# Patient Record
Sex: Female | Born: 1960 | Race: White | Hispanic: No | Marital: Married | Smoking: Current every day smoker
Health system: Southern US, Community
[De-identification: ages and names within clinical notes are randomized; demographics above are authoritative.]

## PROBLEM LIST (undated history)

## (undated) DIAGNOSIS — I639 Cerebral infarction, unspecified: Secondary | ICD-10-CM

## (undated) DIAGNOSIS — F32A Depression, unspecified: Secondary | ICD-10-CM

## (undated) DIAGNOSIS — B9681 Helicobacter pylori [H. pylori] as the cause of diseases classified elsewhere: Secondary | ICD-10-CM

## (undated) DIAGNOSIS — T7840XA Allergy, unspecified, initial encounter: Secondary | ICD-10-CM

## (undated) DIAGNOSIS — K5790 Diverticulosis of intestine, part unspecified, without perforation or abscess without bleeding: Secondary | ICD-10-CM

## (undated) DIAGNOSIS — I10 Essential (primary) hypertension: Secondary | ICD-10-CM

## (undated) DIAGNOSIS — G43909 Migraine, unspecified, not intractable, without status migrainosus: Secondary | ICD-10-CM

## (undated) DIAGNOSIS — K2970 Gastritis, unspecified, without bleeding: Secondary | ICD-10-CM

## (undated) DIAGNOSIS — N809 Endometriosis, unspecified: Secondary | ICD-10-CM

## (undated) DIAGNOSIS — F419 Anxiety disorder, unspecified: Secondary | ICD-10-CM

## (undated) DIAGNOSIS — K648 Other hemorrhoids: Secondary | ICD-10-CM

## (undated) DIAGNOSIS — E785 Hyperlipidemia, unspecified: Secondary | ICD-10-CM

## (undated) DIAGNOSIS — K449 Diaphragmatic hernia without obstruction or gangrene: Secondary | ICD-10-CM

## (undated) DIAGNOSIS — K259 Gastric ulcer, unspecified as acute or chronic, without hemorrhage or perforation: Secondary | ICD-10-CM

## (undated) HISTORY — DX: Anxiety disorder, unspecified: F41.9

## (undated) HISTORY — DX: Diaphragmatic hernia without obstruction or gangrene: K44.9

## (undated) HISTORY — DX: Endometriosis, unspecified: N80.9

## (undated) HISTORY — DX: Cerebral infarction, unspecified: I63.9

## (undated) HISTORY — PX: CHOLECYSTECTOMY: SHX55

## (undated) HISTORY — DX: Other hemorrhoids: K64.8

## (undated) HISTORY — DX: Depression, unspecified: F32.A

## (undated) HISTORY — DX: Allergy, unspecified, initial encounter: T78.40XA

## (undated) HISTORY — DX: Gastritis, unspecified, without bleeding: K29.70

## (undated) HISTORY — DX: Migraine, unspecified, not intractable, without status migrainosus: G43.909

## (undated) HISTORY — PX: UPPER GI ENDOSCOPY: SHX6162

## (undated) HISTORY — PX: NEPHRECTOMY: SHX65

## (undated) HISTORY — PX: LAPAROSCOPIC HYSTERECTOMY: SHX1926

## (undated) HISTORY — DX: Hyperlipidemia, unspecified: E78.5

## (undated) HISTORY — PX: COLONOSCOPY: SHX174

## (undated) HISTORY — DX: Diverticulosis of intestine, part unspecified, without perforation or abscess without bleeding: K57.90

---
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
## 2003-03-09 DIAGNOSIS — K2970 Gastritis, unspecified, without bleeding: Secondary | ICD-10-CM

## 2003-03-09 DIAGNOSIS — B9681 Helicobacter pylori [H. pylori] as the cause of diseases classified elsewhere: Secondary | ICD-10-CM

## 2003-03-09 HISTORY — DX: Helicobacter pylori (H. pylori) as the cause of diseases classified elsewhere: B96.81

## 2003-03-09 HISTORY — DX: Gastritis, unspecified, without bleeding: K29.70

## 2014-03-08 DIAGNOSIS — K259 Gastric ulcer, unspecified as acute or chronic, without hemorrhage or perforation: Secondary | ICD-10-CM

## 2014-03-08 HISTORY — DX: Gastric ulcer, unspecified as acute or chronic, without hemorrhage or perforation: K25.9

## 2016-12-23 ENCOUNTER — Emergency Department (HOSPITAL_COMMUNITY): Payer: BLUE CROSS/BLUE SHIELD

## 2016-12-23 ENCOUNTER — Emergency Department (HOSPITAL_COMMUNITY)
Admission: EM | Admit: 2016-12-23 | Discharge: 2016-12-23 | Disposition: A | Discharge status: Home / Self Care | Payer: BLUE CROSS/BLUE SHIELD | Attending: Emergency Medicine | Admitting: Emergency Medicine

## 2016-12-23 ENCOUNTER — Encounter (HOSPITAL_COMMUNITY): Payer: Self-pay | Admitting: Emergency Medicine

## 2016-12-23 DIAGNOSIS — Z79899 Other long term (current) drug therapy: Secondary | ICD-10-CM | POA: Diagnosis not present

## 2016-12-23 DIAGNOSIS — I10 Essential (primary) hypertension: Secondary | ICD-10-CM | POA: Diagnosis not present

## 2016-12-23 DIAGNOSIS — R51 Headache: Secondary | ICD-10-CM | POA: Diagnosis present

## 2016-12-23 DIAGNOSIS — F17210 Nicotine dependence, cigarettes, uncomplicated: Secondary | ICD-10-CM | POA: Insufficient documentation

## 2016-12-23 DIAGNOSIS — R509 Fever, unspecified: Secondary | ICD-10-CM | POA: Insufficient documentation

## 2016-12-23 DIAGNOSIS — J018 Other acute sinusitis: Secondary | ICD-10-CM | POA: Diagnosis not present

## 2016-12-23 DIAGNOSIS — R519 Headache, unspecified: Secondary | ICD-10-CM

## 2016-12-23 HISTORY — DX: Essential (primary) hypertension: I10

## 2016-12-23 LAB — CBC WITH DIFFERENTIAL/PLATELET
Basophils Absolute: 0.1 10*3/uL (ref 0.0–0.1)
Basophils Relative: 0 %
EOS PCT: 2 %
Eosinophils Absolute: 0.3 10*3/uL (ref 0.0–0.7)
HCT: 39.1 % (ref 36.0–46.0)
Hemoglobin: 13.1 g/dL (ref 12.0–15.0)
LYMPHS ABS: 1.6 10*3/uL (ref 0.7–4.0)
LYMPHS PCT: 10 %
MCH: 33.1 pg (ref 26.0–34.0)
MCHC: 33.5 g/dL (ref 30.0–36.0)
MCV: 98.7 fL (ref 78.0–100.0)
MONO ABS: 1.6 10*3/uL — AB (ref 0.1–1.0)
MONOS PCT: 10 %
Neutro Abs: 12.8 10*3/uL — ABNORMAL HIGH (ref 1.7–7.7)
Neutrophils Relative %: 78 %
PLATELETS: 474 10*3/uL — AB (ref 150–400)
RBC: 3.96 MIL/uL (ref 3.87–5.11)
RDW: 13.2 % (ref 11.5–15.5)
WBC: 16.4 10*3/uL — AB (ref 4.0–10.5)

## 2016-12-23 LAB — COMPREHENSIVE METABOLIC PANEL
ALK PHOS: 86 U/L (ref 38–126)
ALT: 21 U/L (ref 14–54)
AST: 28 U/L (ref 15–41)
Albumin: 3.6 g/dL (ref 3.5–5.0)
Anion gap: 11 (ref 5–15)
BILIRUBIN TOTAL: 0.5 mg/dL (ref 0.3–1.2)
BUN: 5 mg/dL — AB (ref 6–20)
CHLORIDE: 97 mmol/L — AB (ref 101–111)
CO2: 25 mmol/L (ref 22–32)
CREATININE: 0.75 mg/dL (ref 0.44–1.00)
Calcium: 8.8 mg/dL — ABNORMAL LOW (ref 8.9–10.3)
GLUCOSE: 108 mg/dL — AB (ref 65–99)
POTASSIUM: 3.6 mmol/L (ref 3.5–5.1)
Sodium: 133 mmol/L — ABNORMAL LOW (ref 135–145)
Total Protein: 6.6 g/dL (ref 6.5–8.1)

## 2016-12-23 LAB — CSF CELL COUNT WITH DIFFERENTIAL
RBC Count, CSF: 355 /mm3 — ABNORMAL HIGH
RBC Count, CSF: 1 /mm3 — ABNORMAL HIGH
TUBE #: 4
Tube #: 1
WBC, CSF: 9 /mm3 — ABNORMAL HIGH (ref 0–5)
WBC, CSF: 2 /mm3 (ref 0–5)

## 2016-12-23 LAB — PROTEIN, CSF: Total  Protein, CSF: 28 mg/dL (ref 15–45)

## 2016-12-23 LAB — GLUCOSE, CSF: GLUCOSE CSF: 62 mg/dL (ref 40–70)

## 2016-12-23 LAB — I-STAT CG4 LACTIC ACID, ED: LACTIC ACID, VENOUS: 0.84 mmol/L (ref 0.5–1.9)

## 2016-12-23 MED ORDER — LEVOFLOXACIN 500 MG PO TABS: 500.0000 mg | ORAL_TABLET | Freq: Every day | ORAL | 0 refills | Status: DC

## 2016-12-23 MED ORDER — SODIUM CHLORIDE 0.9 % IV BOLUS (SEPSIS)
1000.0000 mL | Freq: Once | INTRAVENOUS | Status: AC
  Administered 2016-12-23: 1000 mL via INTRAVENOUS

## 2016-12-23 MED ORDER — MORPHINE SULFATE (PF) 4 MG/ML IV SOLN
4.0000 mg | Freq: Once | INTRAVENOUS | Status: AC
  Administered 2016-12-23: 4 mg via INTRAVENOUS
  Filled 2016-12-23: qty 1

## 2016-12-23 MED ORDER — METOCLOPRAMIDE HCL 5 MG/ML IJ SOLN
10.0000 mg | Freq: Once | INTRAMUSCULAR | Status: AC
  Administered 2016-12-23: 10 mg via INTRAVENOUS
  Filled 2016-12-23: qty 2

## 2016-12-23 MED ORDER — LIDOCAINE-EPINEPHRINE (PF) 2 %-1:200000 IJ SOLN
10.0000 mL | Freq: Once | INTRAMUSCULAR | Status: DC
  Filled 2016-12-23: qty 20

## 2016-12-23 NOTE — ED Notes (Signed)
Pt stable, ambulatory, states understanding of discharge instructions 

## 2016-12-23 NOTE — ED Provider Notes (Signed)
[facility]PART[facility] Provider Note   CSN: [id number] Arrival date & time: 12/23/16  1419     History   Chief Complaint Chief Complaint  Patient presents with  . Headache    HPI Nicole Scott is a 56 y.o. female.  The history is provided by the patient.  Headache   This is a new problem. The current episode started more than 1 week ago. The problem occurs constantly. The problem has been gradually worsening. The headache is associated with nothing. The pain is located in the bilateral region. The quality of the pain is described as dull. The pain is at a severity of 10/10. The pain is severe. The pain does not radiate. Associated symptoms include a fever and nausea. Pertinent negatives include no near-syncope, no syncope, no shortness of breath and no vomiting.   56 year old female who presents with severe generalized headache. She reports that her headache started about 2-1/2-3 weeks ago, initially as sinus pressure over her frontal and maxillary sinuses. She has had increased nasal drainage. She was seen at a urgent care and was Given prescriptions for Mucinex, an antibiotic, and steroids. She has finished her prescriptions, and states that her headache has been gradually worsening and now a 15 out of 10 in severity. Reports that she has had mild photophobia and nausea but no vomiting. Did develop a fever of 103 Fahrenheit yesterday. Denies any new vision or speech changes, focal numbness or weakness.she was seen at an outpatient ENT clinic today. Her husband reports that they looked into her sinuses and did not seem impressed.She was referred to the ED for additional workup.    Past Medical History:  Diagnosis Date  . Hypertension     There are no active problems to display for this patient.   History reviewed. No pertinent surgical history.  OB History    No data available       Home Medications    Prior to Admission medications   Medication  Sig Start Date End Date Taking? Authorizing Provider  Aspirin-Salicylamide-Caffeine (BC HEADACHE PO) Take 1 application by mouth daily as needed.   Yes [provider]  DM-Phenylephrine-Acetaminophen (ALKA-SELTZER PLS SINUS & COUGH PO) Take 1 Package by mouth as needed.   Yes [provider]  escitalopram (LEXAPRO) 10 MG tablet Take 10 mg by mouth daily.   Yes [provider]  omeprazole (PRILOSEC) 20 MG capsule Take 20 mg by mouth daily.   Yes [provider]  vitamin C (ASCORBIC ACID) 500 MG tablet Take 500 mg by mouth daily.   Yes [provider]  levofloxacin (LEVAQUIN) 500 MG tablet Take 1 tablet (500 mg total) by mouth daily. 12/23/16   Forde Dandy, MD    Family History No family history on file.  Social History Social History  Substance Use Topics  . Smoking status: Current Every Day Smoker    Types: Cigarettes  . Smokeless tobacco: Never Used  . Alcohol use No     Allergies   Patient has no known allergies.   Review of Systems Review of Systems  Constitutional: Positive for fever.  Respiratory: Negative for shortness of breath.   Cardiovascular: Negative for syncope and near-syncope.  Gastrointestinal: Positive for nausea. Negative for vomiting.  Neurological: Positive for headaches.  All other systems reviewed and are negative.    Physical Exam Updated Vital Signs BP (!) 136/96   Pulse 83   Temp 98.6 F (37 C) (Oral)   Resp  16   Ht 5\' 3"  (1.6 m)   Wt 52.2 kg (115 lb)   SpO2 96%   BMI 20.37 kg/m   Physical Exam Physical Exam  Nursing note and vitals reviewed. Constitutional: Well developed, well nourished, non-toxic, and in no acute distress Head: Normocephalic and atraumatic.  Mouth/Throat: Oropharynx is clear and moist.  Neck: Normal range of motion. Neck supple. No nuchal rigidity Cardiovascular: Normal rate and regular rhythm.   Pulmonary/Chest: Effort normal and breath sounds normal.  Abdominal:  Soft. There is no tenderness. There is no rebound and no guarding.  Musculoskeletal: Normal range of motion.  Skin: Skin is warm and dry.  Psychiatric: Cooperative Neurological:  Alert, oriented to person, place, time, and situation. Memory grossly in tact. Fluent speech. No dysarthria or aphasia.  Cranial nerves: VF are full.  Pupils are symmetric, and reactive to light. EOMI without nystagmus. No gaze deviation. Facial muscles symmetric with activation. Sensation to light touch over face in tact bilaterally. Hearing grossly in tact. Palate elevates symmetrically. Head turn and shoulder shrug are intact. Tongue midline.  Reflexes defered.  Muscle bulk and tone normal. No pronator drift. Moves all extremities symmetrically. Sensation to light touch is in tact throughout in bilateral upper and lower extremities. Coordination reveals no dysmetria with finger to nose. Gait is narrow-based and steady. Non-ataxic.    ED Treatments / Results  Labs (all labs ordered are listed, but only abnormal results are displayed) Labs Reviewed  COMPREHENSIVE METABOLIC PANEL - Abnormal; Notable for the following:       Result Value   Sodium 133 (*)    Chloride 97 (*)    Glucose, Bld 108 (*)    BUN 5 (*)    Calcium 8.8 (*)    All other components within normal limits  CBC WITH DIFFERENTIAL/PLATELET - Abnormal; Notable for the following:    WBC 16.4 (*)    Platelets 474 (*)    Neutro Abs 12.8 (*)    Monocytes Absolute 1.6 (*)    All other components within normal limits  CSF CELL COUNT WITH DIFFERENTIAL - Abnormal; Notable for the following:    RBC Count, CSF 355 (*)    WBC, CSF 9 (*)    All other components within normal limits  CSF CELL COUNT WITH DIFFERENTIAL - Abnormal; Notable for the following:    RBC Count, CSF 1 (*)    All other components within normal limits  CSF CULTURE  GLUCOSE, CSF  PROTEIN, CSF  I-STAT CG4 LACTIC ACID, ED    EKG  EKG Interpretation None        Radiology Ct Head Wo Contrast  Result Date: 12/23/2016 CLINICAL DATA:  Severe headache, worst headache of life EXAM: CT HEAD WITHOUT CONTRAST CT MAXILLOFACIAL WITHOUT CONTRAST TECHNIQUE: Multidetector CT imaging of the head and maxillofacial structures were performed using the standard protocol without intravenous contrast. Multiplanar CT image reconstructions of the maxillofacial structures were also generated. COMPARISON:  None. FINDINGS: CT HEAD FINDINGS Brain: Ventricle size normal. Mild hypodensity periventricular white matter appears chronic. Negative for acute infarct. Negative for hemorrhage mass or edema. Vascular: Negative for hyperdense vessel. Skull: Negative Other: None CT MAXILLOFACIAL FINDINGS Osseous: Negative for fracture.  No focal skeletal lesion Orbits: Normal orbital structures bilaterally. Sinuses: Mucosal edema throughout the paranasal sinuses. Obstruction of the ostiomeatal complex bilaterally due to mucosal edema. No air-fluid level. Defect in the membranous component of the nasal septum. Nasal septum is mildly deviated to the left. Soft tissues: Negative IMPRESSION:  No acute intracranial abnormality. Mild chronic white matter changes consistent with microvascular ischemia Mucosal edema throughout the paranasal sinuses with obstruction of the ostiomeatal complex bilaterally. Defect in the membranous portion of the nasal septum. No focal bony abnormality in the face. Electronically Signed   By: Franchot Gallo M.D.   On: 12/23/2016 15:57   Ct Maxillofacial Wo Contrast  Result Date: 12/23/2016 CLINICAL DATA:  Severe headache, worst headache of life EXAM: CT HEAD WITHOUT CONTRAST CT MAXILLOFACIAL WITHOUT CONTRAST TECHNIQUE: Multidetector CT imaging of the head and maxillofacial structures were performed using the standard protocol without intravenous contrast. Multiplanar CT image reconstructions of the maxillofacial structures were also generated. COMPARISON:  None. FINDINGS: CT  HEAD FINDINGS Brain: Ventricle size normal. Mild hypodensity periventricular white matter appears chronic. Negative for acute infarct. Negative for hemorrhage mass or edema. Vascular: Negative for hyperdense vessel. Skull: Negative Other: None CT MAXILLOFACIAL FINDINGS Osseous: Negative for fracture.  No focal skeletal lesion Orbits: Normal orbital structures bilaterally. Sinuses: Mucosal edema throughout the paranasal sinuses. Obstruction of the ostiomeatal complex bilaterally due to mucosal edema. No air-fluid level. Defect in the membranous component of the nasal septum. Nasal septum is mildly deviated to the left. Soft tissues: Negative IMPRESSION: No acute intracranial abnormality. Mild chronic white matter changes consistent with microvascular ischemia Mucosal edema throughout the paranasal sinuses with obstruction of the ostiomeatal complex bilaterally. Defect in the membranous portion of the nasal septum. No focal bony abnormality in the face. Electronically Signed   By: Franchot Gallo M.D.   On: 12/23/2016 15:57    Procedures .Lumbar Puncture Date/Time: 12/23/2016 11:33 PM Performed by: Brantley Stage DUO Authorized by: Brantley Stage DUO   Consent:    Consent obtained:  Verbal   Consent given by:  Patient   Risks discussed:  Bleeding, infection, headache, nerve damage and pain   Alternatives discussed:  No treatment and observation Pre-procedure details:    Procedure purpose:  Diagnostic   Preparation: Patient was prepped and draped in usual sterile fashion   Anesthesia (see MAR for exact dosages):    Anesthesia method:  Local infiltration   Local anesthetic:  Lidocaine 2% WITH epi   (including critical care time)  Medications Ordered in ED Medications  metoCLOPramide (REGLAN) injection 10 mg (10 mg Intravenous Given 12/23/16 2032)  sodium chloride 0.9 % bolus 1,000 mL (0 mLs Intravenous Stopped 12/23/16 2332)  morphine 4 MG/ML injection 4 mg (4 mg Intravenous Given 12/23/16 2032)      Initial Impression / Assessment and Plan / ED Course  I have reviewed the triage vital signs and the nursing notes.  Pertinent labs & imaging results that were available during my care of the patient were reviewed by me and considered in my medical decision making (see chart for details).     Presents with escalating headache for 2.5 weeks. Afebrile in ED, hypertensive, but has not taken BP medication for several days. Neuro exam in tact. No meningismus.   CT head and face visualized. Shows no significant intracranial processes such as bleeding or tumor. CT face with evidence of sinus edema that could be c/w sinusitis. She has recently take course of antibiotics last week. Question meningitis has sinusitis symptoms persistent, now with worsening headache, fever at home and milder presentation because of recent antibiotic usage. Discussed risk benefits regarding LP, patient has agreed.   CSF not suggestive of infection or SAH. Patient improved after symptomatic treatment. Given some signs of persistent sinusitis on CT, will treat with levaquin.  To follow-up with PCP and ENT. Strict return and follow-up instructions reviewed. She expressed understanding of all discharge instructions and felt comfortable with the plan of care.     Final Clinical Impressions(s) / ED Diagnoses   Final diagnoses:  Bad headache  Other acute sinusitis, recurrence not specified    New Prescriptions Discharge Medication List as of 12/23/2016 11:29 PM       Forde Dandy, MD 12/24/16 1415

## 2016-12-23 NOTE — ED Notes (Signed)
Spoke with dr ray regarding pt's headache-- orders received. Pt remains in triage room 4 with lights turned off.

## 2016-12-23 NOTE — ED Notes (Signed)
HUisband reports pt has been treated for sinusitis with antibiotics and prednisone inhaler. Pt has taken all this medication with no relief. [place] the only relief she gets are with ice packs. She was seen at ENT today in [place] and ENT recommended she come here for CT scan.

## 2016-12-23 NOTE — ED Triage Notes (Addendum)
Pt sent here from ENT dr (Dr. Elwyn Reach, etc), with hx of sinusitis-- pt c/o severe headache, pt has been taking sinus BC, alka selzer sinus, has not taken BP meds today. Has been out of BP med for 1-2 weeks.

## 2016-12-23 NOTE — ED Notes (Signed)
Pt's husband informed this nurse that pt has been to [facility] ED with "acting crazy" after son died-- most recently last month-- also states that pt has had positive drug screen for cocaine "and a bunch of stuff" last month.

## 2016-12-23 NOTE — Discharge Instructions (Signed)
Your CT shows swelling in the sinuses that could be consistent with sinusitis. Your lumbar puncture does not show infection or ruptured aneurysm. The brain on CT also does not show any serious processes.  Take antibiotics as prescribed. Get rest. Drink fluids. Take ibuprofen and tylenol for pain.  Return for worsening symptoms, including persistent fevers, intractable vomiting, confusion, escalating pain or any other symptoms concerning to you.

## 2016-12-27 LAB — CSF CULTURE
CULTURE: NO GROWTH
SPECIAL REQUESTS: NORMAL

## 2016-12-27 LAB — CSF CULTURE W GRAM STAIN

## 2018-08-14 IMAGING — CT CT MAXILLOFACIAL W/O CM
3 of 7 series · 15 of 47 positions shown, 18 images · non-contrast
Comparison: None.

CLINICAL DATA: Severe headache, worst headache of life

EXAM:
CT HEAD WITHOUT CONTRAST
CT MAXILLOFACIAL WITHOUT CONTRAST
TECHNIQUE: Multidetector CT imaging of the head and maxillofacial structures
were performed using the standard protocol without intravenous
contrast. Multiplanar CT image reconstructions of the maxillofacial
structures were also generated.

[Series 4: head bone · axial · 0.43mm/px · z∈[-101,+39]mm · 11 of 86 slices shown, 14 images]
[im 8/86  brain]
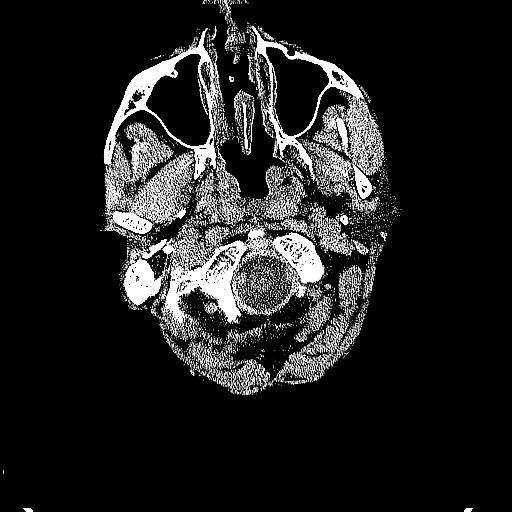
[im 8/86  bone]
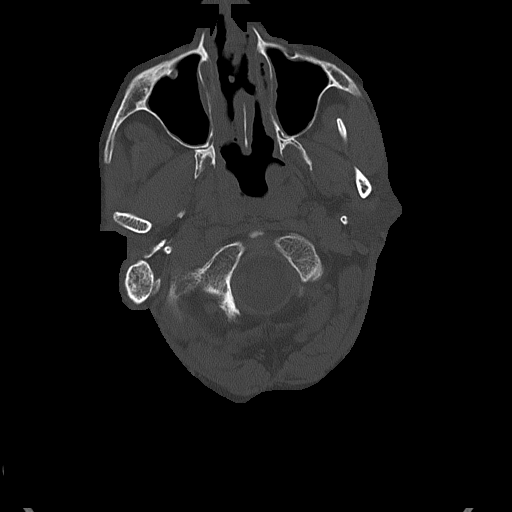
[im 15/86  bone]
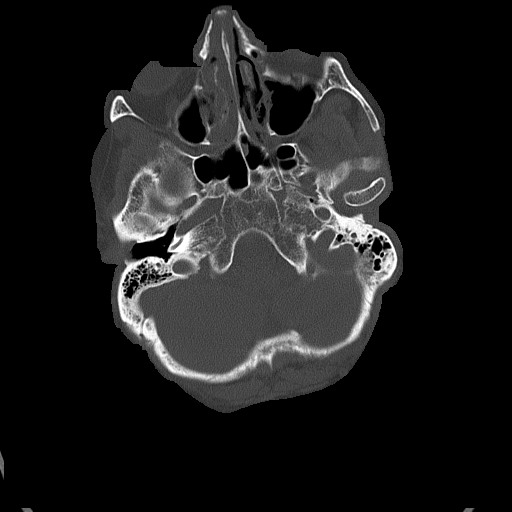
[im 22/86  bone]
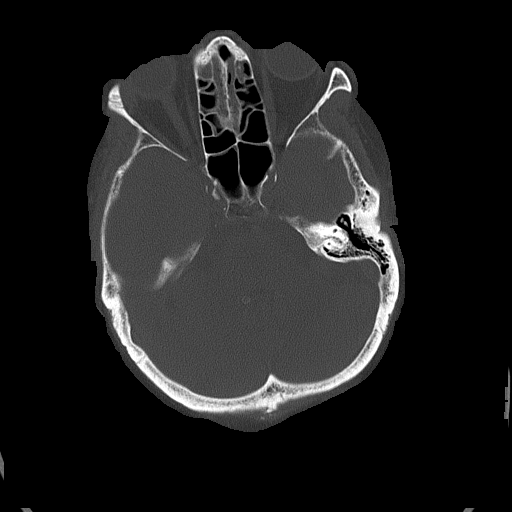
[im 29/86  bone]
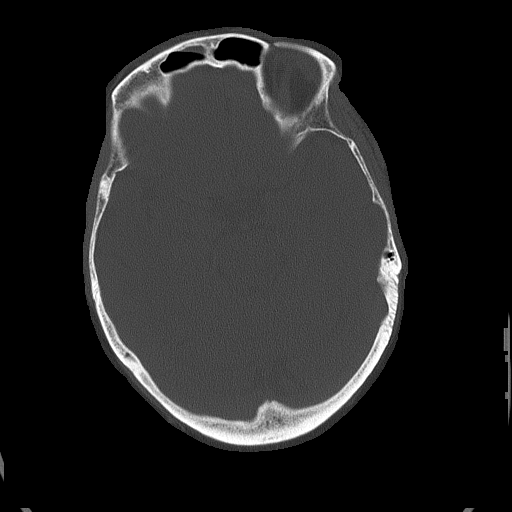
[im 36/86  brain]
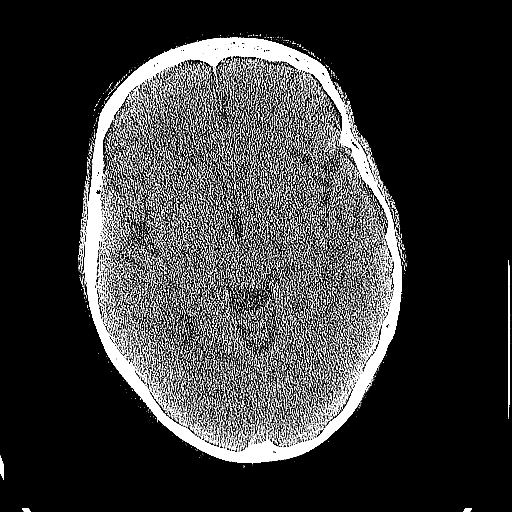
[im 36/86  bone]
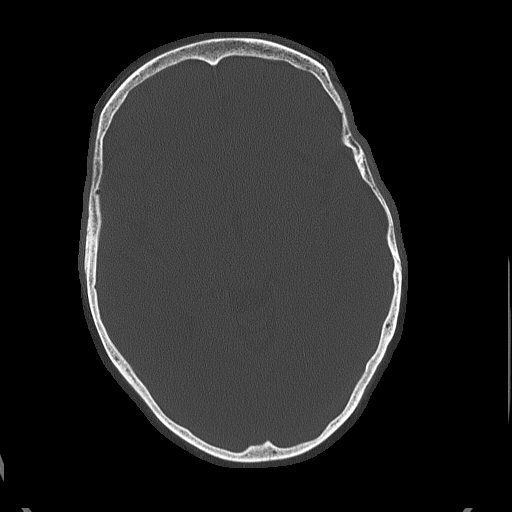
[im 43/86  bone]
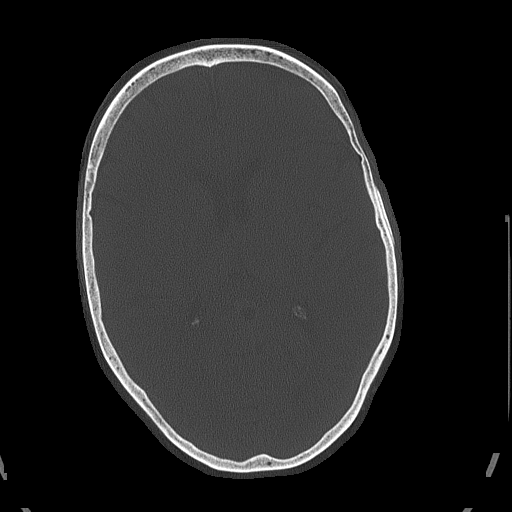
[im 50/86  bone]
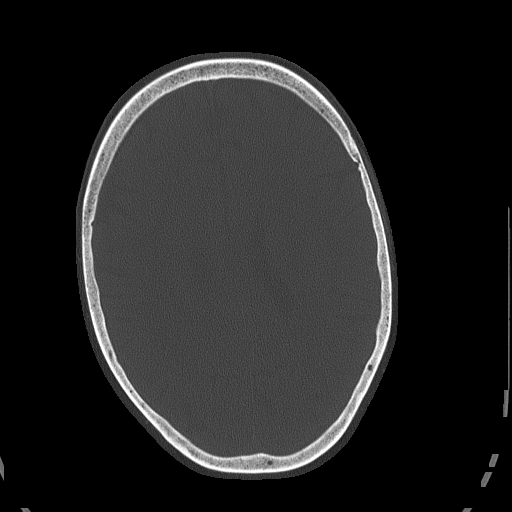
[im 57/86  bone]
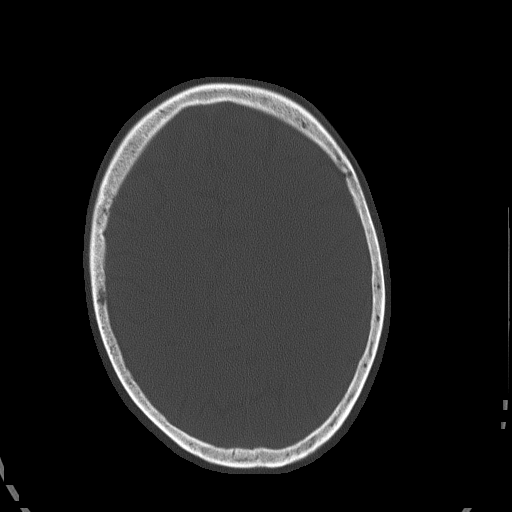
[im 64/86  brain]
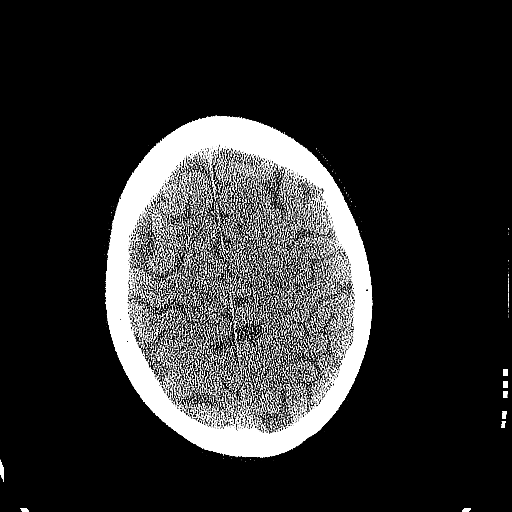
[im 64/86  bone]
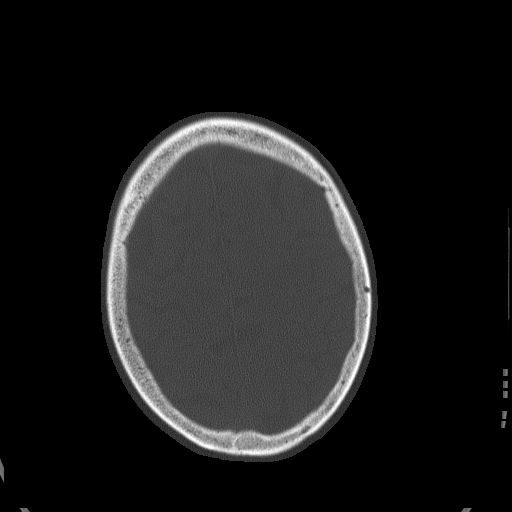
[im 71/86  bone]
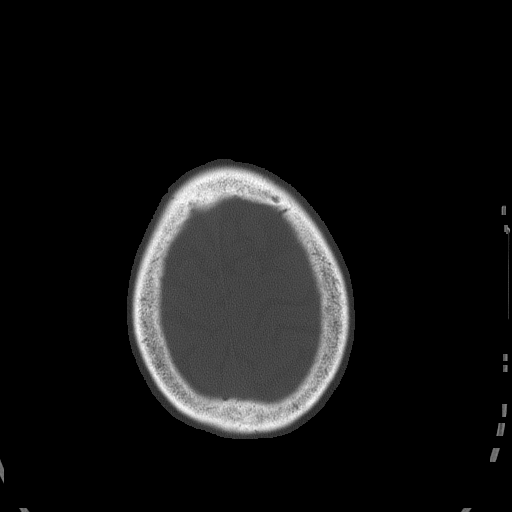
[im 78/86  bone]
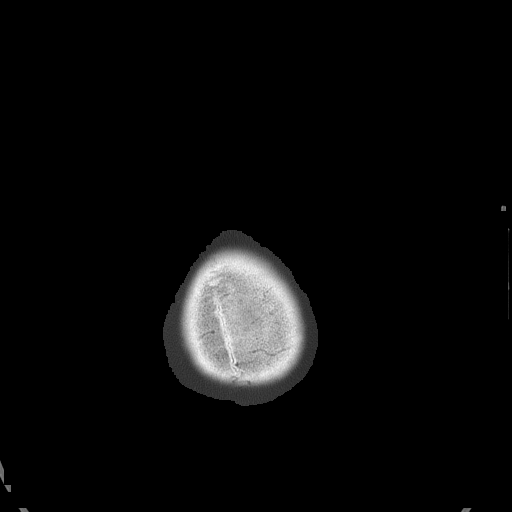

[Series 5: head without cor · coronal · non-contrast · 0.33mm/px · 3 of 70 slices shown]
[im 18/70  bone]
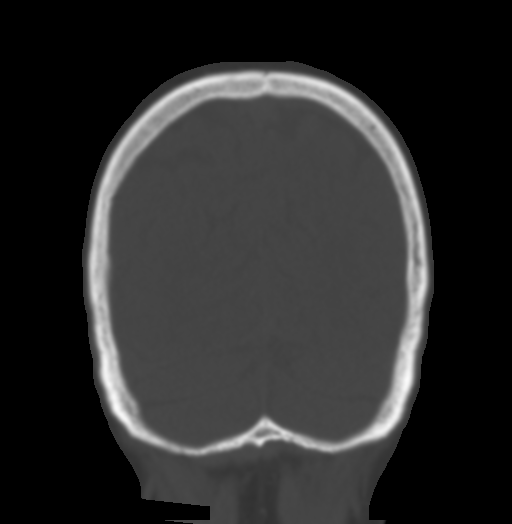
[im 35/70  bone]
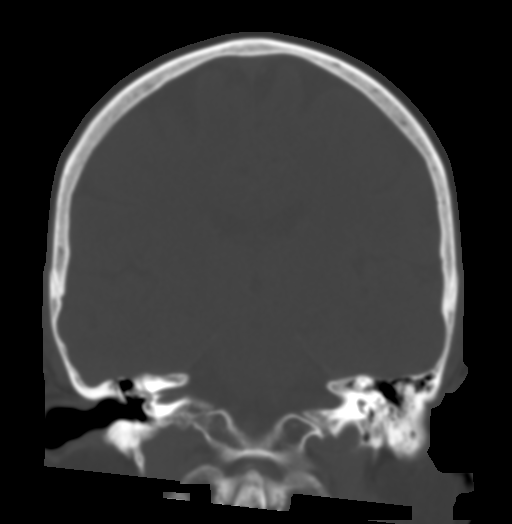
[im 52/70  bone]
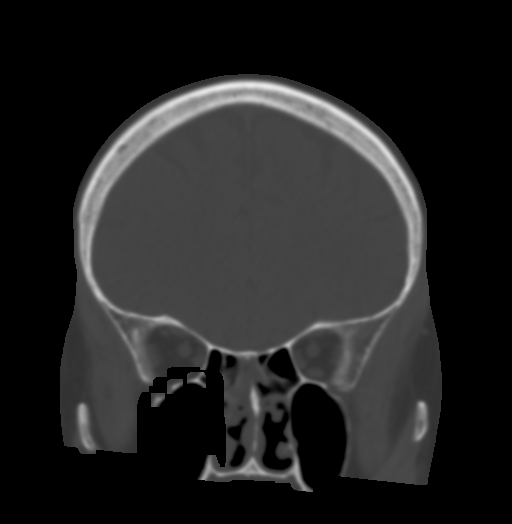

[Series 6: head without sag · sagittal · non-contrast · 0.34mm/px · 1 of 56 slices shown]
[im 28/56  bone]
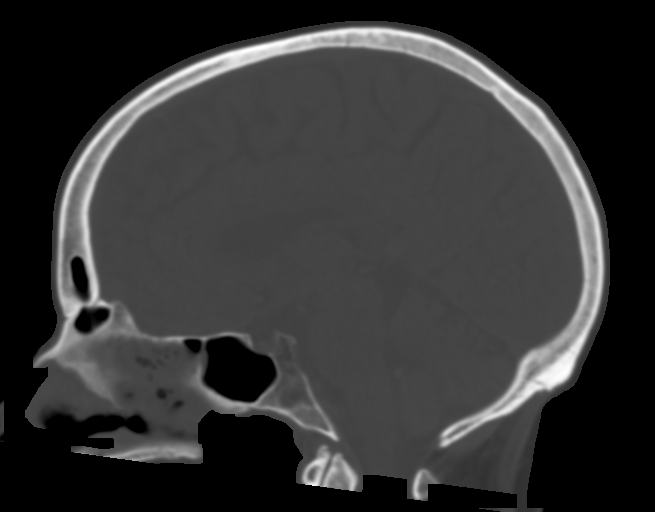

[15 of 47 positions shown; findings below may reference images not displayed]

FINDINGS: CT HEAD FINDINGS

Brain: Ventricle size normal. Mild hypodensity periventricular white
matter appears chronic. Negative for acute infarct. Negative for
hemorrhage mass or edema.

Vascular: Negative for hyperdense vessel.

Skull: Negative

Other: None

CT MAXILLOFACIAL FINDINGS

Osseous: Negative for fracture.  No focal skeletal lesion

Orbits: Normal orbital structures bilaterally.

Sinuses: Mucosal edema throughout the paranasal sinuses. Obstruction
of the ostiomeatal complex bilaterally due to mucosal edema. No
air-fluid level. Defect in the membranous component of the nasal
septum. Nasal septum is mildly deviated to the left.

Soft tissues: Negative
IMPRESSION: No acute intracranial abnormality. Mild chronic white matter changes
consistent with microvascular ischemia

Mucosal edema throughout the paranasal sinuses with obstruction of
the ostiomeatal complex bilaterally. Defect in the membranous
portion of the nasal septum.

No focal bony abnormality in the face.

## 2018-11-15 ENCOUNTER — Telehealth: Payer: Self-pay | Admitting: Internal Medicine

## 2018-11-15 NOTE — Telephone Encounter (Signed)
Hi Dr. Hilarie Fredrickson, this patient would like to transfer her care over to you, she stated that you were recommended by several people that she knows. She is aware that you will be out of the office for the next two weeks. However, she is willing to wait. She needs to be seen for several GI issues including H-Pylori and she just had a colon last month. Her records will be placed on your desk for review. Please advise on scheduling. Thank you

## 2018-11-15 NOTE — Telephone Encounter (Signed)
I left a message with pt's husband to call back. We received pt's GI records from [facility]. It looks like patient wants to transfer to Dr. Hilarie Fredrickson. Please confirm that and why she needs to be seen for. She just had a colon on 11/06/18.

## 2018-11-22 NOTE — Telephone Encounter (Signed)
Okay for office visit in my clinic Please hold records for that appointment JMP

## 2018-11-27 ENCOUNTER — Encounter: Payer: Self-pay | Admitting: Internal Medicine

## 2018-12-01 ENCOUNTER — Encounter: Payer: Self-pay | Admitting: Internal Medicine

## 2018-12-01 NOTE — Telephone Encounter (Signed)
Left message to call back to schedule consult.  

## 2019-01-05 ENCOUNTER — Encounter: Payer: Self-pay | Admitting: *Deleted

## 2019-01-11 ENCOUNTER — Other Ambulatory Visit: Payer: Self-pay

## 2019-01-11 ENCOUNTER — Ambulatory Visit (INDEPENDENT_AMBULATORY_CARE_PROVIDER_SITE_OTHER): Payer: BC Managed Care – PPO | Admitting: Internal Medicine

## 2019-01-11 ENCOUNTER — Encounter: Payer: Self-pay | Admitting: Internal Medicine

## 2019-01-11 ENCOUNTER — Other Ambulatory Visit (INDEPENDENT_AMBULATORY_CARE_PROVIDER_SITE_OTHER): Payer: BC Managed Care – PPO

## 2019-01-11 VITALS — BP 140/88 | HR 70 | Temp 98.2°F | Ht 63.0 in | Wt 122.8 lb

## 2019-01-11 DIAGNOSIS — K219 Gastro-esophageal reflux disease without esophagitis: Secondary | ICD-10-CM

## 2019-01-11 DIAGNOSIS — R1310 Dysphagia, unspecified: Secondary | ICD-10-CM

## 2019-01-11 DIAGNOSIS — Z1159 Encounter for screening for other viral diseases: Secondary | ICD-10-CM | POA: Diagnosis not present

## 2019-01-11 DIAGNOSIS — R5383 Other fatigue: Secondary | ICD-10-CM

## 2019-01-11 DIAGNOSIS — R1319 Other dysphagia: Secondary | ICD-10-CM

## 2019-01-11 DIAGNOSIS — Z800 Family history of malignant neoplasm of digestive organs: Secondary | ICD-10-CM | POA: Diagnosis not present

## 2019-01-11 DIAGNOSIS — K589 Irritable bowel syndrome without diarrhea: Secondary | ICD-10-CM

## 2019-01-11 LAB — CBC WITH DIFFERENTIAL/PLATELET
Basophils Absolute: 0.1 10*3/uL (ref 0.0–0.1)
Basophils Relative: 0.7 % (ref 0.0–3.0)
Eosinophils Absolute: 0.1 10*3/uL (ref 0.0–0.7)
Eosinophils Relative: 1.1 % (ref 0.0–5.0)
HCT: 45.7 % (ref 36.0–46.0)
Hemoglobin: 15.3 g/dL — ABNORMAL HIGH (ref 12.0–15.0)
Lymphocytes Relative: 25.9 % (ref 12.0–46.0)
Lymphs Abs: 2.3 10*3/uL (ref 0.7–4.0)
MCHC: 33.4 g/dL (ref 30.0–36.0)
MCV: 98.9 fl (ref 78.0–100.0)
Monocytes Absolute: 0.8 10*3/uL (ref 0.1–1.0)
Monocytes Relative: 8.9 % (ref 3.0–12.0)
Neutro Abs: 5.7 10*3/uL (ref 1.4–7.7)
Neutrophils Relative %: 63.4 % (ref 43.0–77.0)
Platelets: 295 10*3/uL (ref 150.0–400.0)
RBC: 4.62 Mil/uL (ref 3.87–5.11)
RDW: 13.6 % (ref 11.5–15.5)
WBC: 9 10*3/uL (ref 4.0–10.5)

## 2019-01-11 LAB — COMPREHENSIVE METABOLIC PANEL
ALT: 22 U/L (ref 0–35)
AST: 24 U/L (ref 0–37)
Albumin: 4.6 g/dL (ref 3.5–5.2)
Alkaline Phosphatase: 116 U/L (ref 39–117)
BUN: 12 mg/dL (ref 6–23)
CO2: 29 mEq/L (ref 19–32)
Calcium: 9.5 mg/dL (ref 8.4–10.5)
Chloride: 107 mEq/L (ref 96–112)
Creatinine, Ser: 0.82 mg/dL (ref 0.40–1.20)
GFR: 71 mL/min (ref >60.00)
Glucose, Bld: 89 mg/dL (ref 70–99)
Potassium: 4 mEq/L (ref 3.5–5.1)
Sodium: 144 mEq/L (ref 135–145)
Total Bilirubin: 0.3 mg/dL (ref 0.2–1.2)
Total Protein: 7.3 g/dL (ref 6.0–8.3)

## 2019-01-11 LAB — VITAMIN B12: Vitamin B-12: 1126 pg/mL — ABNORMAL HIGH (ref 211–911)

## 2019-01-11 LAB — IBC + FERRITIN
Ferritin: 100.2 ng/mL (ref 10.0–291.0)
Iron: 173 ug/dL — ABNORMAL HIGH (ref 42–145)
Saturation Ratios: 66.4 % — ABNORMAL HIGH (ref 20.0–50.0)
Transferrin: 186 mg/dL — ABNORMAL LOW (ref 212.0–360.0)

## 2019-01-11 LAB — FOLATE: Folate: 12.8 ng/mL (ref >5.9)

## 2019-01-11 LAB — TSH: TSH: 0.33 u[IU]/mL — ABNORMAL LOW (ref 0.35–4.50)

## 2019-01-11 LAB — IGA: IgA: 144 mg/dL (ref 68–378)

## 2019-01-11 MED ORDER — SUPREP BOWEL PREP KIT 17.5-3.13-1.6 GM/177ML PO SOLN: 1.0000 | ORAL | 0 refills | Status: DC

## 2019-01-11 NOTE — Patient Instructions (Signed)
You have been scheduled for an endoscopy and colonoscopy. Please follow the written instructions given to you at your visit today. Please pick up your prep supplies at the pharmacy within the next 1-3 days. If you use inhalers (even only as needed), please bring them with you on the day of your procedure. Your physician has requested that you go to www.startemmi.com and enter the access code given to you at your visit today. This web site gives a general overview about your procedure. However, you should still follow specific instructions given to you by our office regarding your preparation for the procedure.  Your provider has requested that you go to the basement level for lab work before leaving today. Press "B" on the elevator. The lab is located at the first door on the left as you exit the elevator.  Continue omeprazole 40 mg daily.  Continue probiotics.  Discontinue sucralfate (carafate).  If you are age 94 or older, your body mass index should be between 23-30. Your Body mass index is 21.75 kg/m. If this is out of the aforementioned range listed, please consider follow up with your Primary Care Provider.  If you are age 39 or younger, your body mass index should be between 19-25. Your Body mass index is 21.75 kg/m. If this is out of the aformentioned range listed, please consider follow up with your Primary Care Provider.

## 2019-01-11 NOTE — Progress Notes (Signed)
Patient ID: [name], female   DOB: 11-Sep-1960, 58 y.o.   MRN: [id number] HPI: Nicole Scott is a 58 year old female with a past medical history of GERD, hiatal hernia, H. pylori gastritis, colonic diverticulosis, family history of gastric and colon cancer, hypertension who is seen in consult at the request of Higinio Plan, FNP to establish care.  She is here alone today.  She previously had her GI care in [place] most recently with Dr. Earley Brooke and prior to this with Dr. West Carbo.  She reports that she has had a longstanding history with gastrointestinal issues.  She has had multiple prior procedures and radiographic testing.  She currently is taking omeprazole 40 mg a day, sucralfate 1 g 3 times daily before meals and at bedtime and Culturelle probiotic.  She is taking B12 supplement.  At present one of her biggest complaints is issues with dysphagia.  She notes that foods such as breads and meats have a hard time going down with swallowing.  She reports she will have to stop eating leave the table and often regurgitate the food back up and out before she is able to eat again.  She feels that meats digest slowly and empty slowly from her stomach.  She does report that the omeprazole helps the heartburn and she is not having epigastric or other abdominal pain of late.  She has lost weight 30 pounds this year.  She was treated in the past for H. pylori.  She does feel occasional nocturnal regurgitation and substernal chest discomfort.  Her bowel habits which previously were very loose have normalized with daily probiotic.  She also has not had the lower abdominal pain she had previously.  She avoids coffee which definitively triggers nausea and vomiting in the morning.  She does report fatigue as a significant issue for her.  She has a significant family history of stomach cancer in her mother, colon cancer in her paternal grandmother and paternal grandfather.  Her father died of brain cancer.  She had  a maternal aunt with breast cancer.  She donated a kidney to her daughter 23 years ago today, she is also had a prior hysterectomy and cholecystectomy.  Please see the objective section of this note for prior pertinent GI procedures and imaging  Past Medical History:  Diagnosis Date  . Diverticulosis   . Endometriosis   . Gastric ulcer 03/2014  . Gastritis   . Helicobacter pylori gastritis 2005  . Hiatal hernia   . Hypertension   . Internal hemorrhoids     Past Surgical History:  Procedure Laterality Date  . CHOLECYSTECTOMY    . LAPAROSCOPIC HYSTERECTOMY    . NEPHRECTOMY Left     Outpatient Medications Prior to Visit  Medication Sig Dispense Refill  . calcium carbonate (OS-CAL) 1250 (500 Ca) MG chewable tablet Chew 1 tablet by mouth daily.    . Lactobacillus (CULTURELLE DIGESTIVE WOMENS PO) Take by mouth.    Marland Kitchen lisinopril (ZESTRIL) 20 MG tablet Take 20 mg by mouth 2 (two) times daily.    Marland Kitchen omeprazole (PRILOSEC) 20 MG capsule Take 20 mg by mouth daily.    . sertraline (ZOLOFT) 100 MG tablet Take 100 mg by mouth daily.    . vitamin B-12 (CYANOCOBALAMIN) 1000 MCG tablet Take 1,000 mcg by mouth daily.    . vitamin C (ASCORBIC ACID) 500 MG tablet Take 500 mg by mouth daily.    . sucralfate (CARAFATE) 1 g tablet Take 1 g by mouth 4 (four) times  daily -  with meals and at bedtime.    . Aspirin-Salicylamide-Caffeine (BC HEADACHE PO) Take 1 application by mouth daily as needed.    Marland Kitchen DM-Phenylephrine-Acetaminophen (ALKA-SELTZER PLS SINUS & COUGH PO) Take 1 Package by mouth as needed.    Marland Kitchen escitalopram (LEXAPRO) 10 MG tablet Take 10 mg by mouth daily.    Marland Kitchen levofloxacin (LEVAQUIN) 500 MG tablet Take 1 tablet (500 mg total) by mouth daily. (Patient not taking: Reported on 01/11/2019) 7 tablet 0   No facility-administered medications prior to visit.     Allergies  Allergen Reactions  . Penicillins     Family History  Problem Relation Age of Onset  . Stomach cancer Father   . Brain  cancer Father   . Colon cancer Paternal Grandmother   . Breast cancer Maternal Aunt   . Colon polyps Brother     Social History   Tobacco Use  . Smoking status: Current Every Day Smoker    Types: Cigarettes  . Smokeless tobacco: Never Used  Substance Use Topics  . Alcohol use: No  . Drug use: No    ROS: As per history of present illness, otherwise negative  BP 140/88 (BP Location: Left Arm, Patient Position: Sitting)   Pulse 70   Temp 98.2 F (36.8 C)   Ht 5\' 3"  (1.6 m)   Wt 122 lb 12.8 oz (55.7 kg)   SpO2 98%   BMI 21.75 kg/m  Constitutional: Well-developed and well-nourished. No distress. HEENT: Normocephalic and atraumatic.  Conjunctivae are normal.  No scleral icterus. Neck: Neck supple. Trachea midline. Cardiovascular: Normal rate, regular rhythm and intact distal pulses. No M/R/G Pulmonary/chest: Effort normal and breath sounds normal. No wheezing, rales or rhonchi. Abdominal: Soft, nontender, nondistended. Bowel sounds active throughout. There are no masses palpable. No hepatosplenomegaly. Extremities: no clubbing, cyanosis, or edema Neurological: Alert and oriented to person place and time. Skin: Skin is warm and dry.  Psychiatric: Normal mood and affect. Behavior is normal.  RELEVANT LABS AND IMAGING: Colonoscopy dated 11/06/2018 --poor preparation.  Stool throughout the colon particularly in the right limited examination.  Though it is not mentioned there is evidence of diverticulosis in the sigmoid by pictures from this procedure.  EGD dated 08/14/2018 --hiatal hernia, antral erosions, erythema and erosions in the duodenum with duodenitis  CBC 09/13/2018 --within normal limits, hemoglobin 14.9, platelet count 242 CMP 09/13/2018 --within normal limits except for glucose slightly elevated at 100, AST 28, ALT 21, total bilirubin 0.3, alkaline phosphatase 87, albumin 4.5  Ultrasound abdomen complete 08/11/2018 --status post cholecystectomy with mild dilatation of the  common bile duct likely due to reservoir effect.  Liver normal in size and echotexture.  Normal spleen  Gastric emptying scan 03/31/2017 --normal  Pathology results reviewed from prior procedures: January 2016 --gastric biopsies surface erosion and chronic inflammation of the antrum.  No H. pylori October 2018 -mild chronic gastritis no H. Pylori March 2019 --mild chronic gastropathy chronic gastritis.  No H. Pylori; mildly acanthotic stratified squamous mucosa without pathologic change distal esophagus February 2019 --nonspecific mild chronic inflammation of the sigmoid. June 2020 superficial erosion and chronic inflammation of gastric mucosa.  H. pylori negative.  Duodenal biopsy no pathologic change   ASSESSMENT/PLAN:  58 year old female with a past medical history of GERD, hiatal hernia, history of H. pylori gastritis and ulcer disease, colonic diverticulosis, family history of gastric and colon cancer, hypertension who is seen in consult at the request of Higinio Plan, FNP to establish care.  I have reviewed extensive records from Dr. Earley Brooke and she has had numerous prior upper endoscopies, several colonoscopies and imaging studies.  At present her biggest issues continue to be GERD but specifically dysphagia.  She also has a strong family history of colon cancer and had a colonoscopy with inadequate prep recently.  1.  GERD with dysphagia history of gastritis most recently H. pylori negative--suspicious for Schatzki's ring associated with hiatal hernia based on her solid food dysphagia.  She has had prior biopsies negative for EOE.  I recommended repeat upper endoscopy with esophageal dilation if appropriate.  We discussed the risk, benefits and alternatives and she is agreeable and wishes to proceed.  Omeprazole is helping with her reflux and I do not see a reason to continue Carafate. --Upper endoscopy as above --Continue omeprazole 40 mg once daily --Discontinue Carafate  2.  History of  IBS and loose stools --loose stools have improved with daily probiotic.  Continue Culturelle once daily  3.  Family history of colon cancer/recent colonoscopy with poor preparation --we discussed that the poor colon preparation documented on her colonoscopy in August precludes adequate screening.  For this reason I recommended we repeat the test with a 2-day bowel preparation.  If preparation is normal and no polyps were seen then the next colonoscopy would be recommended in 5 years time based on family history --Colonoscopy with 2-day bowel preparation  4.  Fatigue --check TSH, celiac panel, B12, folate and iron studies   [id number], [address], [place] [id number]   Lelan Pons, MD

## 2019-01-12 LAB — TISSUE TRANSGLUTAMINASE, IGA: (tTG) Ab, IgA: 1 U/mL

## 2019-01-24 ENCOUNTER — Telehealth: Payer: Self-pay | Admitting: Internal Medicine

## 2019-01-24 ENCOUNTER — Encounter: Payer: Self-pay | Admitting: Internal Medicine

## 2019-01-24 NOTE — Telephone Encounter (Signed)
Left message asking patient to let me know which [facility] in [place], [place] she would like script sent to.Marland KitchenMarland KitchenMarland Kitchen

## 2019-01-25 MED ORDER — SUPREP BOWEL PREP KIT 17.5-3.13-1.6 GM/177ML PO SOLN: 1.0000 | ORAL | 0 refills | Status: DC

## 2019-01-25 NOTE — Telephone Encounter (Signed)
[address] is the correct [address] address. Rx resent.

## 2019-01-31 ENCOUNTER — Ambulatory Visit: Payer: BC Managed Care – PPO

## 2019-01-31 ENCOUNTER — Other Ambulatory Visit: Payer: Self-pay | Admitting: Internal Medicine

## 2019-01-31 DIAGNOSIS — Z1159 Encounter for screening for other viral diseases: Secondary | ICD-10-CM

## 2019-02-02 LAB — SARS CORONAVIRUS 2 (TAT 6-24 HRS): SARS Coronavirus 2: NEGATIVE

## 2019-02-05 ENCOUNTER — Ambulatory Visit (AMBULATORY_SURGERY_CENTER): Payer: BC Managed Care – PPO | Admitting: Internal Medicine

## 2019-02-05 ENCOUNTER — Other Ambulatory Visit: Payer: Self-pay

## 2019-02-05 ENCOUNTER — Encounter: Payer: Self-pay | Admitting: Internal Medicine

## 2019-02-05 VITALS — BP 143/82 | HR 80 | Temp 98.4°F | Resp 16 | Ht 63.0 in | Wt 122.0 lb

## 2019-02-05 DIAGNOSIS — D124 Benign neoplasm of descending colon: Secondary | ICD-10-CM | POA: Diagnosis not present

## 2019-02-05 DIAGNOSIS — K2970 Gastritis, unspecified, without bleeding: Secondary | ICD-10-CM | POA: Diagnosis not present

## 2019-02-05 DIAGNOSIS — D120 Benign neoplasm of cecum: Secondary | ICD-10-CM

## 2019-02-05 DIAGNOSIS — K2951 Unspecified chronic gastritis with bleeding: Secondary | ICD-10-CM | POA: Diagnosis not present

## 2019-02-05 DIAGNOSIS — D125 Benign neoplasm of sigmoid colon: Secondary | ICD-10-CM | POA: Diagnosis not present

## 2019-02-05 DIAGNOSIS — D123 Benign neoplasm of transverse colon: Secondary | ICD-10-CM | POA: Diagnosis not present

## 2019-02-05 DIAGNOSIS — R1319 Other dysphagia: Secondary | ICD-10-CM

## 2019-02-05 DIAGNOSIS — K3189 Other diseases of stomach and duodenum: Secondary | ICD-10-CM

## 2019-02-05 DIAGNOSIS — K219 Gastro-esophageal reflux disease without esophagitis: Secondary | ICD-10-CM | POA: Diagnosis not present

## 2019-02-05 DIAGNOSIS — Z800 Family history of malignant neoplasm of digestive organs: Secondary | ICD-10-CM

## 2019-02-05 DIAGNOSIS — R1310 Dysphagia, unspecified: Secondary | ICD-10-CM | POA: Diagnosis not present

## 2019-02-05 MED ORDER — SODIUM CHLORIDE 0.9 % IV SOLN: 500.0000 mL | Freq: Once | INTRAVENOUS | Status: DC

## 2019-02-05 NOTE — Progress Notes (Signed)
Called to room to assist during endoscopic procedure.  Patient ID and intended procedure confirmed with present staff. Received instructions for my participation in the procedure from the performing physician.  

## 2019-02-05 NOTE — Progress Notes (Signed)
Pt's states no medical or surgical changes since previsit or office visit. 

## 2019-02-05 NOTE — Op Note (Signed)
[facility] Patient Name: Nicole Scott Procedure Date: 02/05/2019 1:51 PM MRN: [id number] Endoscopist: Jerene Bears , MD Age: 58 Referring MD:  Date of Birth: September 02, 1960 Gender: Female Account #: [id number] Procedure:                Upper GI endoscopy Indications:              Dysphagia, Gastro-esophageal reflux disease Medicines:                Monitored Anesthesia Care Procedure:                Pre-Anesthesia Assessment:                           - Prior to the procedure, a History and Physical                            was performed, and patient medications and                            allergies were reviewed. The patient's tolerance of                            previous anesthesia was also reviewed. The risks                            and benefits of the procedure and the sedation                            options and risks were discussed with the patient.                            All questions were answered, and informed consent                            was obtained. Prior Anticoagulants: The patient has                            taken no previous anticoagulant or antiplatelet                            agents. ASA Grade Assessment: II - A patient with                            mild systemic disease. After reviewing the risks                            and benefits, the patient was deemed in                            satisfactory condition to undergo the procedure.                           After obtaining informed consent, the endoscope was  passed under direct vision. Throughout the                            procedure, the patient's blood pressure, pulse, and                            oxygen saturations were monitored continuously. The                            Endoscope was introduced through the mouth, and                            advanced to the second part of duodenum. The upper                            GI endoscopy was  accomplished without difficulty.                            The patient tolerated the procedure well. Scope In: Scope Out: Findings:                 Normal mucosa was found in the entire esophagus.                           No endoscopic abnormality was evident in the                            esophagus to explain the patient's complaint of                            dysphagia. It was decided, however, to proceed with                            dilation of the entire esophagus. The scope was                            withdrawn. Dilation was performed with a Maloney                            dilator with mild resistance at 52 Fr. The dilation                            site was examined following endoscope reinsertion                            and showed mild mucosal disruption at the UES.                           The cardia and gastric fundus were normal on                            retroflexion.  Moderate inflammation characterized by congestion                            (edema), erosions and erythema was found in the                            gastric antrum and in the prepyloric region of the                            stomach. Biopsies were taken with a cold forceps                            for histology and Helicobacter pylori testing.                           The examined duodenum was normal. Complications:            No immediate complications. Estimated Blood Loss:     Estimated blood loss was minimal. Impression:               - Normal mucosa was found in the entire esophagus.                            No endoscopic esophageal abnormality to explain                            patient's dysphagia. Esophagus dilated with 52 Fr                            Maloney.                           - Gastritis. Biopsied.                           - Normal examined duodenum. Recommendation:           - Patient has a contact number available for                             emergencies. The signs and symptoms of potential                            delayed complications were discussed with the                            patient. Return to normal activities tomorrow.                            Written discharge instructions were provided to the                            patient.                           - Post-dilation diet and advance diet as tolerated.                           -  Continue present medications including omeprazole                            20 mg daily.                           - Await pathology results. Jerene Bears, MD 02/05/2019 2:29:25 PM This report has been signed electronically.

## 2019-02-05 NOTE — Patient Instructions (Signed)
HANDOUTS PROVIDED ON: GASTRITIS, POLYPS, & DIVERTICULOSIS   THE BIOPSIES AND POLYPS TAKEN TODAY HAVE BEEN SENT FOR PATHOLOGY.  THE RESULTS CAN TAKE 2-3 WEEKS TO RECEIVE.  BASED ON THE RESULTS IS WHEN YOUR NEXT COLONOSCOPY WILL BE RECOMMENDED.  YOU MAY RESUME YOUR PREVIOUS DIET AND MEDICATION SCHEDULE.  [place] YOU FOR ALLOWING [place] TO CARE FOR YOU TODAY!!!  YOU HAD AN ENDOSCOPIC PROCEDURE TODAY AT [facility]:   Refer to the procedure report that was given to you for any specific questions about what was found during the examination.  If the procedure report does not answer your questions, please call your gastroenterologist to clarify.  If you requested that your care partner not be given the details of your procedure findings, then the procedure report has been included in a sealed envelope for you to review at your convenience later.  YOU SHOULD EXPECT: Some feelings of bloating in the abdomen. Passage of more gas than usual.  Walking can help get rid of the air that was put into your GI tract during the procedure and reduce the bloating. If you had a lower endoscopy (such as a colonoscopy or flexible sigmoidoscopy) you may notice spotting of blood in your stool or on the toilet paper. If you underwent a bowel prep for your procedure, you may not have a normal bowel movement for a few days.  Please Note:  You might notice some irritation and congestion in your nose or some drainage.  This is from the oxygen used during your procedure.  There is no need for concern and it should clear up in a day or so.  SYMPTOMS TO REPORT IMMEDIATELY:   Following lower endoscopy (colonoscopy or flexible sigmoidoscopy):  Excessive amounts of blood in the stool  Significant tenderness or worsening of abdominal pains  Swelling of the abdomen that is new, acute  Fever of 100F or higher   Following upper endoscopy (EGD)  Vomiting of blood or coffee ground material  New chest pain or pain under  the shoulder blades  Painful or persistently difficult swallowing  New shortness of breath  Fever of 100F or higher  Black, tarry-looking stools  For urgent or emergent issues, a gastroenterologist can be reached at any hour by calling [phone].   DIET:  We do recommend a small meal at first, but then you may proceed to your regular diet.  Drink plenty of fluids but you should avoid alcoholic beverages for 24 hours.  ACTIVITY:  You should plan to take it easy for the rest of today and you should NOT DRIVE or use heavy machinery until tomorrow (because of the sedation medicines used during the test).    FOLLOW UP: Our staff will call the number listed on your records 48-72 hours following your procedure to check on you and address any questions or concerns that you may have regarding the information given to you following your procedure. If we do not reach you, we will leave a message.  We will attempt to reach you two times.  During this call, we will ask if you have developed any symptoms of COVID 19. If you develop any symptoms (ie: fever, flu-like symptoms, shortness of breath, cough etc.) before then, please call [phone].  If you test positive for Covid 19 in the 2 weeks post procedure, please call and report this information to [place].    If any biopsies were taken you will be contacted by phone or by letter within the  next 1-3 weeks.  Please call us at [phone] if you have not heard about the biopsies in 3 weeks.    SIGNATURES/CONFIDENTIALITY: You and/or your care partner have signed paperwork which will be entered into your electronic medical record.  These signatures attest to the fact that that the information above on your After Visit Summary has been reviewed and is understood.  Full responsibility of the confidentiality of this discharge information lies with you and/or your care-partner.

## 2019-02-05 NOTE — Op Note (Signed)
[place] Patient Name: Nicole Scott Procedure Date: 02/05/2019 2:04 PM MRN: [id number] Endoscopist: Jerene Bears , MD Age: 58 Referring MD:  Date of Birth: 01/27/1961 Gender: Female Account #: [id number] Procedure:                Colonoscopy Indications:              Colon cancer screening in patient at increased                            risk: Family history of colorectal cancer in                            multiple 2nd degree relatives; colonoscopy                            performed in [employer], [place] in Aug 2020 but                            preparation was poor. Medicines:                Monitored Anesthesia Care Procedure:                Pre-Anesthesia Assessment:                           - Prior to the procedure, a History and Physical                            was performed, and patient medications and                            allergies were reviewed. The patient's tolerance of                            previous anesthesia was also reviewed. The risks                            and benefits of the procedure and the sedation                            options and risks were discussed with the patient.                            All questions were answered, and informed consent                            was obtained. Prior Anticoagulants: The patient has                            taken no previous anticoagulant or antiplatelet                            agents. ASA Grade Assessment: II - A patient with  mild systemic disease. After reviewing the risks                            and benefits, the patient was deemed in                            satisfactory condition to undergo the procedure.                           - Prior to the procedure, a History and Physical                            was performed, and patient medications and                            allergies were reviewed. The patient's tolerance of        previous anesthesia was also reviewed. The risks                            and benefits of the procedure and the sedation                            options and risks were discussed with the patient.                            All questions were answered, and informed consent                            was obtained. Prior Anticoagulants: The patient has                            taken no previous anticoagulant or antiplatelet                            agents. ASA Grade Assessment: II - A patient with                            mild systemic disease. After reviewing the risks                            and benefits, the patient was deemed in                            satisfactory condition to undergo the procedure.                           After obtaining informed consent, the colonoscope                            was passed under direct vision. Throughout the                            procedure, the patient's blood pressure, pulse, and  oxygen saturations were monitored continuously. The                            Colonoscope was introduced through the anus and                            advanced to the cecum, identified by appendiceal                            orifice and ileocecal valve. The colonoscopy was                            performed without difficulty. The patient tolerated                            the procedure well. The quality of the bowel                            preparation was excellent (2 day MiraLax + Suprep).                            The ileocecal valve, appendiceal orifice, and                            rectum were photographed. Scope In: 2:05:40 PM Scope Out: 2:22:50 PM Scope Withdrawal Time: 0 hours 14 minutes 34 seconds  Total Procedure Duration: 0 hours 17 minutes 10 seconds  Findings:                 The digital rectal exam was normal.                           A 3 mm polyp was found in the ileocecal valve. The                             polyp was sessile. The polyp was removed with a                            cold biopsy forceps. Resection and retrieval were                            complete.                           A 5 mm polyp was found in the transverse colon. The                            polyp was sessile. The polyp was removed with a                            cold snare. Resection and retrieval were complete.                           A 2 mm polyp was found  in the transverse colon. The                            polyp was sessile. The polyp was removed with a                            cold biopsy forceps. Resection and retrieval were                            complete.                           A 4 mm polyp was found in the descending colon. The                            polyp was sessile. The polyp was removed with a                            cold snare. Resection and retrieval were complete.                           A 5 mm polyp was found in the distal sigmoid colon.                            The polyp was sessile. The polyp was removed with a                            cold snare. Resection and retrieval were complete.                           Multiple small-mouthed diverticula were found in                            the sigmoid colon and hepatic flexure.                           The retroflexed view of the distal rectum and anal                            verge was normal and showed no anal or rectal                            abnormalities. Complications:            No immediate complications. Estimated Blood Loss:     Estimated blood loss was minimal. Impression:               - One 3 mm polyp at the ileocecal valve, removed                            with a cold biopsy forceps. Resected and retrieved.                           - One 5 mm polyp  in the transverse colon, removed                            with a cold snare. Resected and retrieved.                           -  One 2 mm polyp in the transverse colon, removed                            with a cold biopsy forceps. Resected and retrieved.                           - One 4 mm polyp in the descending colon, removed                            with a cold snare. Resected and retrieved.                           - One 5 mm polyp in the distal sigmoid colon,                            removed with a cold snare. Resected and retrieved.                           - Diverticulosis in the sigmoid colon and at the                            hepatic flexure.                           - The distal rectum and anal verge are normal on                            retroflexion view. Recommendation:           - Patient has a contact number available for                            emergencies. The signs and symptoms of potential                            delayed complications were discussed with the                            patient. Return to normal activities tomorrow.                            Written discharge instructions were provided to the                            patient.                           - Resume previous diet.                           -  Continue present medications.                           - Await pathology results.                           - Repeat colonoscopy is recommended for                            surveillance. The colonoscopy date will be                            determined after pathology results from today's                            exam become available for review. Jerene Bears, MD 02/05/2019 2:34:10 PM This report has been signed electronically.

## 2019-02-05 NOTE — Progress Notes (Signed)
Report to PACU, RN, vss, BBS= Clear.  

## 2019-02-07 ENCOUNTER — Telehealth: Payer: Self-pay

## 2019-02-07 NOTE — Telephone Encounter (Signed)
  Follow up Call-  Call back number 02/05/2019  Post procedure Call Back phone  # [phone]  Permission to leave phone message Yes  Some recent data might be hidden     Patient questions:  Do you have a fever, pain , or abdominal swelling? No. Pain Score  0 *  Have you tolerated food without any problems? Yes.    Have you been able to return to your normal activities? Yes.    Do you have any questions about your discharge instructions: Diet   No. Medications  No. Follow up visit  No.  Do you have questions or concerns about your Care? No.  Actions: * If pain score is 4 or above: No action needed, pain <4.  1. Have you developed a fever since your procedure? no  2.   Have you had an respiratory symptoms (SOB or cough) since your procedure? no  3.   Have you tested positive for COVID 19 since your procedure no  4.   Have you had any family members/close contacts diagnosed with the COVID 19 since your procedure?  no   If yes to any of these questions please route to Joylene John, RN and Alphonsa Gin, Therapist, sports.

## 2019-02-11 ENCOUNTER — Encounter: Payer: Self-pay | Admitting: Internal Medicine

## 2019-06-21 DIAGNOSIS — I6381 Other cerebral infarction due to occlusion or stenosis of small artery: Secondary | ICD-10-CM | POA: Insufficient documentation

## 2019-08-20 ENCOUNTER — Ambulatory Visit (INDEPENDENT_AMBULATORY_CARE_PROVIDER_SITE_OTHER): Payer: BC Managed Care – PPO | Admitting: Neurology

## 2019-08-20 ENCOUNTER — Encounter: Payer: Self-pay | Admitting: Neurology

## 2019-08-20 VITALS — BP 129/90 | HR 100 | Wt 131.6 lb

## 2019-08-20 DIAGNOSIS — R29898 Other symptoms and signs involving the musculoskeletal system: Secondary | ICD-10-CM | POA: Diagnosis not present

## 2019-08-20 DIAGNOSIS — I6381 Other cerebral infarction due to occlusion or stenosis of small artery: Secondary | ICD-10-CM | POA: Diagnosis not present

## 2019-08-20 MED ORDER — ASPIRIN EC 81 MG PO TBEC: 81.0000 mg | DELAYED_RELEASE_TABLET | Freq: Every day | ORAL | 11 refills | Status: AC

## 2019-08-20 NOTE — Patient Instructions (Signed)
I had a long d/w patient and her daughter about her recent stroke,  Lacunar infarcts,risk for recurrent stroke/TIAs, personally independently reviewed imaging studies and stroke evaluation results and answered questions.Continue aspirin 81 mg daily alone and discontinue Plavix now for secondary stroke prevention and maintain strict control of hypertension with blood pressure goal below 130/90, diabetes with hemoglobin A1c goal below 6.5% and lipids with LDL cholesterol goal below 70 mg/dL. I also advised the patient to eat a healthy diet with plenty of whole grains, cereals, fruits and vegetables, exercise regularly and maintain ideal body weight. She was counseled to quit smoking completely and is willing. Check MRI scan of the brain, MRI of the brain, lipid profile and hemoglobin A1c. Followup in the future with my nurse practitioner Janett Billow in 2 months or call earlier if necessary.  Stroke Prevention Some medical conditions and behaviors are associated with a higher chance of having a stroke. You can help prevent a stroke by making nutrition, lifestyle, and other changes, including managing any medical conditions you may have. What nutrition changes can be made?   Eat healthy foods. You can do this by: ? Choosing foods high in fiber, such as fresh fruits and vegetables and whole grains. ? Eating at least 5 or more servings of fruits and vegetables a day. Try to fill half of your plate at each meal with fruits and vegetables. ? Choosing lean protein foods, such as lean cuts of meat, poultry without skin, fish, tofu, beans, and nuts. ? Eating low-fat dairy products. ? Avoiding foods that are high in salt (sodium). This can help lower blood pressure. ? Avoiding foods that have saturated fat, trans fat, and cholesterol. This can help prevent high cholesterol. ? Avoiding processed and premade foods.  Follow your health care provider's specific guidelines for losing weight, controlling high blood  pressure (hypertension), lowering high cholesterol, and managing diabetes. These may include: ? Reducing your daily calorie intake. ? Limiting your daily sodium intake to 1,500 milligrams (mg). ? Using only healthy fats for cooking, such as olive oil, canola oil, or sunflower oil. ? Counting your daily carbohydrate intake. What lifestyle changes can be made?  Maintain a healthy weight. Talk to your health care provider about your ideal weight.  Get at least 30 minutes of moderate physical activity at least 5 days a week. Moderate activity includes brisk walking, biking, and swimming.  Do not use any products that contain nicotine or tobacco, such as cigarettes and e-cigarettes. If you need help quitting, ask your health care provider. It may also be helpful to avoid exposure to secondhand smoke.  Limit alcohol intake to no more than 1 drink a day for nonpregnant women and 2 drinks a day for men. One drink equals 12 oz of beer, 5 oz of wine, or 1 oz of hard liquor.  Stop any illegal drug use.  Avoid taking birth control pills. Talk to your health care provider about the risks of taking birth control pills if: ? You are over 86 years old. ? You smoke. ? You get migraines. ? You have ever had a blood clot. What other changes can be made?  Manage your cholesterol levels. ? Eating a healthy diet is important for preventing high cholesterol. If cholesterol cannot be managed through diet alone, you may also need to take medicines. ? Take any prescribed medicines to control your cholesterol as told by your health care provider.  Manage your diabetes. ? Eating a healthy diet and exercising regularly are  important parts of managing your blood sugar. If your blood sugar cannot be managed through diet and exercise, you may need to take medicines. ? Take any prescribed medicines to control your diabetes as told by your health care provider.  Control your hypertension. ? To reduce your risk of  stroke, try to keep your blood pressure below 130/80. ? Eating a healthy diet and exercising regularly are an important part of controlling your blood pressure. If your blood pressure cannot be managed through diet and exercise, you may need to take medicines. ? Take any prescribed medicines to control hypertension as told by your health care provider. ? Ask your health care provider if you should monitor your blood pressure at home. ? Have your blood pressure checked every year, even if your blood pressure is normal. Blood pressure increases with age and some medical conditions.  Get evaluated for sleep disorders (sleep apnea). Talk to your health care provider about getting a sleep evaluation if you snore a lot or have excessive sleepiness.  Take over-the-counter and prescription medicines only as told by your health care provider. Aspirin or blood thinners (antiplatelets or anticoagulants) may be recommended to reduce your risk of forming blood clots that can lead to stroke.  Make sure that any other medical conditions you have, such as atrial fibrillation or atherosclerosis, are managed. What are the warning signs of a stroke? The warning signs of a stroke can be easily remembered as BEFAST.  B is for balance. Signs include: ? Dizziness. ? Loss of balance or coordination. ? Sudden trouble walking.  E is for eyes. Signs include: ? A sudden change in vision. ? Trouble seeing.  F is for face. Signs include: ? Sudden weakness or numbness of the face. ? The face or eyelid drooping to one side.  A is for arms. Signs include: ? Sudden weakness or numbness of the arm, usually on one side of the body.  S is for speech. Signs include: ? Trouble speaking (aphasia). ? Trouble understanding.  T is for time. ? These symptoms may represent a serious problem that is an emergency. Do not wait to see if the symptoms will go away. Get medical help right away. Call your local emergency services  (911 in the [place] Do not drive yourself to the hospital.  Other signs of stroke may include: ? A sudden, severe headache with no known cause. ? Nausea or vomiting. ? Seizure. Where to find more information For more information, visit:  American Stroke Association: www.strokeassociation.org  National Stroke Association: www.stroke.org Summary  You can prevent a stroke by eating healthy, exercising, not smoking, limiting alcohol intake, and managing any medical conditions you may have.  Do not use any products that contain nicotine or tobacco, such as cigarettes and e-cigarettes. If you need help quitting, ask your health care provider. It may also be helpful to avoid exposure to secondhand smoke.  Remember BEFAST for warning signs of stroke. Get help right away if you or a loved one has any of these signs. This information is not intended to replace advice given to you by your health care provider. Make sure you discuss any questions you have with your health care provider. Document Revised: 02/04/2017 Document Reviewed: 03/30/2016 Elsevier Patient Education  2020 [employer].

## 2019-08-20 NOTE — Progress Notes (Signed)
[facility] [address] [place]. [place] [address] [phone]       OFFICE CONSULT NOTE  Ms. Nicole Scott Date of Birth:  1961/01/05 Medical Record Number:  [id number]   Referring MD: Thornton Papas, NP  Reason for Referral: Stroke HPI: Nicole Scott is a 59 year old pleasant Caucasian lady seen today for initial office consultation visit for stroke.  History is obtained from the patient and her daughters accompanying her as well as review of referral notes.  Actual imaging films are not available for review in PACS. Ms. Nicole Scott is a pleasant 59 year old lady who has a past medical history of anxiety depression hypertension, hyperlipidemia who was seen at [facility] on 06/21/2019 with sudden onset of chest pain and elevated blood pressure. After arrival she also noticed left face arm and leg numbness and weakness. She was seen by telemetry neurologist who felt her symptoms were mild and the risk benefit of IV TPA did not justify it. CT scan of the head was obtained which showed bilateral remote age subcortical lacunar infarcts and mild degree of generalized cerebral atrophy. No acute finding was noted. 2D echo showed normal ejection fraction without cardiac source of embolism. Carotid ultrasound was unremarkable. CBC and basic metabolic panel labs were normal. Patient was started on aspirin for stroke prevention. She was discharged home and she is gradually improving. She still has some intermittent numbness in the left hand and feet and some diminished fine motor skills but otherwise doing fine. For unclear reason patient did not get an MRI done at [facility]. She is was discharged on aspirin and Plavix which she is tolerating well with significant bruising but no bleeding episodes. She continues to smoke but states has cut back to 3 cigarettes/day. She is tolerating Crestor 40 mg daily without any muscle aches and pains. She denies any prior history of strokes, TIAs,  migraines, seizures or significant neurological problems.  ROS:   14 system review of systems is positive for numbness, weakness, decreased stamina, tiredness, easy bruising and all other systems negative  PMH:  Past Medical History:  Diagnosis Date  . Anxiety   . Depression   . Diverticulosis   . Endometriosis   . Gastric ulcer 03/2014  . Gastritis   . Helicobacter pylori gastritis 2005  . Hiatal hernia   . Hyperlipidemia   . Hypertension   . Internal hemorrhoids   . Stroke [facility])     Social History:  Social History   Socioeconomic History  . Marital status: Married    Spouse name: Not on file  . Number of children: Not on file  . Years of education: Not on file  . Highest education level: Not on file  Occupational History  . Not on file  Tobacco Use  . Smoking status: Current Every Day Smoker    Packs/day: 0.25    Types: Cigarettes  . Smokeless tobacco: Never Used  . Tobacco comment: smoke 4 per day  Vaping Use  . Vaping Use: Never used  Substance and Sexual Activity  . Alcohol use: No  . Drug use: No  . Sexual activity: Not on file  Other Topics Concern  . Not on file  Social History Narrative  . Not on file   Social Determinants of Health   Financial Resource Strain:   . Difficulty of Paying Living Expenses:   Food Insecurity:   . Worried About Charity fundraiser in the Last Year:   . Arboriculturist in  the Last Year:   Transportation Needs:   . Film/video editor (Medical):   Marland Kitchen Lack of Transportation (Non-Medical):   Physical Activity:   . Days of Exercise per Week:   . Minutes of Exercise per Session:   Stress:   . Feeling of Stress :   Social Connections:   . Frequency of Communication with Friends and Family:   . Frequency of Social Gatherings with Friends and Family:   . Attends Religious Services:   . Active Member of Clubs or Organizations:   . Attends Archivist Meetings:   Marland Kitchen Marital Status:   Intimate Partner Violence:    . Fear of Current or Ex-Partner:   . Emotionally Abused:   Marland Kitchen Physically Abused:   . Sexually Abused:     Medications:   Current Outpatient Medications on File Prior to Visit  Medication Sig Dispense Refill  . calcium carbonate (OS-CAL) 1250 (500 Ca) MG chewable tablet Chew 1 tablet by mouth daily.    . cariprazine (VRAYLAR) capsule Take by mouth.    . Lactobacillus (CULTURELLE DIGESTIVE WOMENS PO) Take by mouth.    . sertraline (ZOLOFT) 100 MG tablet Take 100 mg by mouth daily.    . vitamin B-12 (CYANOCOBALAMIN) 1000 MCG tablet Take 1,000 mcg by mouth daily.    . vitamin C (ASCORBIC ACID) 500 MG tablet Take 500 mg by mouth daily.     No current facility-administered medications on file prior to visit.    Allergies:   Allergies  Allergen Reactions  . Doxycycline   . Hydromorphone   . Penicillins Swelling    Physical Exam General: well developed, well nourished middle-aged Caucasian lady, seated, in no evident distress Head: head normocephalic and atraumatic.   Neck: supple with no carotid or supraclavicular bruits Cardiovascular: regular rate and rhythm, no murmurs Musculoskeletal: no deformity Skin:  no rash/petichiae Vascular:  Normal pulses all extremities  Neurologic Exam Mental Status: Awake and fully alert. Oriented to place and time. Recent and remote memory intact. Attention span, concentration and fund of knowledge appropriate. Mood and affect appropriate.  Cranial Nerves: Fundoscopic exam reveals sharp disc margins. Pupils equal, briskly reactive to light. Extraocular movements full without nystagmus. Visual fields full to confrontation. Hearing intact. Facial sensation intact. Face, tongue, palate moves normally and symmetrically.  Motor: Normal bulk and tone. Normal strength in all tested extremity muscles.  Diminished fine finger movements on the left.  Mild fine action tremor of both outstretched upper extremities. Sensory.: intact to touch , pinprick ,  position and vibratory sensation.  Coordination: Rapid alternating movements normal in all extremities. Finger-to-nose and heel-to-shin performed accurately bilaterally. Gait and Station: Arises from chair without difficulty. Stance is normal. Gait demonstrates normal stride length and balance . Able to heel, toe and tandem walk with slight difficulty.  Reflexes: 1+ and symmetric. Toes downgoing.   NIHSS  0 Modified Rankin  2   ASSESSMENT: 59 year old Caucasian lady with left hemiparesis in April 2021 likely due to small right subcortical infarct not visualized on CT scan.  Vascular risk factors of smoking, hypertension , hyperlipidemia and silent cerebrovascular disease     PLAN: I had a long d/w patient and her daughter about her recent stroke,  Lacunar infarcts,risk for recurrent stroke/TIAs, personally independently reviewed imaging studies and stroke evaluation results and answered questions.Continue aspirin 81 mg daily alone and discontinue Plavix now for secondary stroke prevention and maintain strict control of hypertension with blood pressure goal below 130/90, diabetes with hemoglobin  A1c goal below 6.5% and lipids with LDL cholesterol goal below 70 mg/dL. I also advised the patient to eat a healthy diet with plenty of whole grains, cereals, fruits and vegetables, exercise regularly and maintain ideal body weight. She was counseled to quit smoking completely and is willing. Check MRI scan of the brain, MRI of the brain, lipid profile and hemoglobin A1c.  Greater than 50% time during this 45-minute consultation visit was spent on counseling and coordination of care about lacunar stroke and left-sided weakness and answering questions about stroke prevention.  Followup in the future with my nurse practitioner Janett Billow in 2 months or call earlier if necessary. Antony Contras, MD  [facility] [address] [place], [place] [address]  Phone [phone] Fax  [phone] Note: This document was prepared with digital dictation and possible smart phrase technology. Any transcriptional errors that result from this process are unintentional.

## 2019-08-21 LAB — LIPID PANEL
Chol/HDL Ratio: 4 ratio (ref 0.0–4.4)
Cholesterol, Total: 295 mg/dL — ABNORMAL HIGH (ref 100–199)
HDL: 74 mg/dL (ref >39)
LDL Chol Calc (NIH): 197 mg/dL — ABNORMAL HIGH (ref 0–99)
Triglycerides: 133 mg/dL (ref 0–149)
VLDL Cholesterol Cal: 24 mg/dL (ref 5–40)

## 2019-08-21 LAB — HEMOGLOBIN A1C
Est. average glucose Bld gHb Est-mCnc: 108 mg/dL
Hgb A1c MFr Bld: 5.4 % (ref 4.8–5.6)

## 2019-08-23 ENCOUNTER — Telehealth: Payer: Self-pay | Admitting: Neurology

## 2019-08-23 NOTE — Telephone Encounter (Signed)
spoke to the patient due to the cost she is going to wait. Id did offer her the payment plan.  BCBS Auth: [id number] (exp. 08/23/19 to 09/21/19)

## 2019-09-06 NOTE — Telephone Encounter (Signed)
BCBS Auth: [id number] (exp. 09/06/19 to 10/05/19) pt scheduled at [facility] for 09/26/19 arrival time is 12:30 pm pt is aware

## 2019-09-23 NOTE — Progress Notes (Signed)
Kindly inform the patient that her cholesterol profile is quite not satisfactory and elevated and if she is taking Crestor 40 mg daily she will need addition of Repatha injections which I will order but will need preauthorization from her insurance

## 2019-09-25 ENCOUNTER — Telehealth: Payer: Self-pay

## 2019-09-25 NOTE — Telephone Encounter (Signed)
-----   Message from Garvin Fila, MD sent at 09/23/2019  4:45 PM EDT ----- Mitchell Heir inform the patient that her cholesterol profile is quite not satisfactory and elevated and if she is taking Crestor 40 mg daily she will need addition of Repatha injections which I will order but will need preauthorization from her insurance

## 2019-09-25 NOTE — Telephone Encounter (Signed)
Attempted to call pt, LVM  

## 2019-09-26 ENCOUNTER — Telehealth: Payer: Self-pay

## 2019-09-26 NOTE — Telephone Encounter (Signed)
Attempted to call pt, LVM for call back  °

## 2019-10-17 NOTE — Telephone Encounter (Signed)
Spoke to pt. she states is taking her Crestor 40mg  daily, and she is okay with starting Repatha Injections  Would like order sent to [facility] in [place], [place], [place]  [place], [place] [address]  Phone:  [phone] Fax:  [phone]

## 2019-10-17 NOTE — Telephone Encounter (Signed)
Pt returned call. Please call back when available and if she is not there she would like for a message to be left.

## 2019-10-18 NOTE — Telephone Encounter (Signed)
Ok will order it but needs pre authorization

## 2019-10-25 NOTE — Telephone Encounter (Signed)
PA pending  We need new Ins info

## 2019-10-29 ENCOUNTER — Ambulatory Visit (INDEPENDENT_AMBULATORY_CARE_PROVIDER_SITE_OTHER): Payer: BC Managed Care – PPO | Admitting: Adult Health

## 2019-10-29 ENCOUNTER — Encounter: Payer: Self-pay | Admitting: Adult Health

## 2019-10-29 VITALS — BP 140/82 | HR 71 | Ht 63.0 in | Wt 131.0 lb

## 2019-10-29 DIAGNOSIS — Z789 Other specified health status: Secondary | ICD-10-CM

## 2019-10-29 DIAGNOSIS — I6381 Other cerebral infarction due to occlusion or stenosis of small artery: Secondary | ICD-10-CM | POA: Diagnosis not present

## 2019-10-29 DIAGNOSIS — G4719 Other hypersomnia: Secondary | ICD-10-CM | POA: Diagnosis not present

## 2019-10-29 DIAGNOSIS — I10 Essential (primary) hypertension: Secondary | ICD-10-CM

## 2019-10-29 DIAGNOSIS — Z720 Tobacco use: Secondary | ICD-10-CM

## 2019-10-29 DIAGNOSIS — E538 Deficiency of other specified B group vitamins: Secondary | ICD-10-CM | POA: Diagnosis not present

## 2019-10-29 DIAGNOSIS — E785 Hyperlipidemia, unspecified: Secondary | ICD-10-CM

## 2019-10-29 MED ORDER — REPATHA SURECLICK 140 MG/ML ~~LOC~~ SOAJ: 140.0000 mg | SUBCUTANEOUS | 4 refills | Status: DC

## 2019-10-29 NOTE — Addendum Note (Signed)
Addended by: Mal Misty on: 10/29/2019 04:52 PM   Modules accepted: Orders

## 2019-10-29 NOTE — Telephone Encounter (Signed)
Any update on Repatha?

## 2019-10-29 NOTE — Progress Notes (Signed)
[facility] [address] [place]. [place] [address] [phone]       OFFICE FOLLOW UP NOTE  Ms. [name] Date of Birth:  06-07-1960 Medical Record Number:  [id number]   Referring MD: Thornton Papas, NP  Reason for Referral: Stroke  Chief Complaint  Patient presents with  . Follow-up    2 month f/u, states she still does not have any energy or strength. Denies any new strokes since April.   . room 5    with husband       HPI:   Today, 10/29/2019, Ms. Hammonds returns for stroke follow-up accompanied by her husband.  Complains of excessive daytime fatigue and generalized weakness which has been present since her stroke.  This has been limiting her daytime activity and hobbies.  Denies difficulty falling asleep but does report nocturia approximately 3 times per night, morning grogginess, and mild snoring in addition to excessive daytime fatigue.  She has not previously underwent sleep study. she continues to have mild left-sided weakness but overall improving history of depression with multiple family/life stressors currently on antidepressant medication.  She believes depression/anxiety has been stable Denies new or worsening stroke/TIA symptoms  Continues on aspirin 81mg  dailywithout bleeding or bruising Continues on Crestor 40 mg daily without myalgias Lipid panel 08/2019 showed elevated LDL at 197 despite compliance on Crestor 40 mg daily therefore recommend initiating Repatha but not initiated yet - patient states previously awaiting insurance approval  Blood pressure today 140/82 -currently on amlodipine and lisinopril managed by cardiology.  She questions possible medication side effect contributing to fatigue continued tobacco use  No further concerns at this time    History provided for reference purposes only Initial visit 08/20/2019 Dr. Leonie Man: Ms. Belay is a 59 year old pleasant Caucasian lady seen today for initial office consultation visit for  stroke.  History is obtained from the patient and her daughters accompanying her as well as review of referral notes.  Actual imaging films are not available for review in PACS. Ms. Curfman is a pleasant 59 year old lady who has a past medical history of anxiety depression hypertension, hyperlipidemia who was seen at [facility] on 06/21/2019 with sudden onset of chest pain and elevated blood pressure. After arrival she also noticed left face arm and leg numbness and weakness. She was seen by telemetry neurologist who felt her symptoms were mild and the risk benefit of IV TPA did not justify it. CT scan of the head was obtained which showed bilateral remote age subcortical lacunar infarcts and mild degree of generalized cerebral atrophy. No acute finding was noted. 2D echo showed normal ejection fraction without cardiac source of embolism. Carotid ultrasound was unremarkable. CBC and basic metabolic panel labs were normal. Patient was started on aspirin for stroke prevention. She was discharged home and she is gradually improving. She still has some intermittent numbness in the left hand and feet and some diminished fine motor skills but otherwise doing fine. For unclear reason patient did not get an MRI done at [facility]. She is was discharged on aspirin and Plavix which she is tolerating well with significant bruising but no bleeding episodes. She continues to smoke but states has cut back to 3 cigarettes/day. She is tolerating Crestor 40 mg daily without any muscle aches and pains. She denies any prior history of strokes, TIAs, migraines, seizures or significant neurological problems.  ROS:   14 system review of systems is positive for see HPI and all other systems negative  PMH:  Past Medical History:  Diagnosis Date  . Anxiety   . Depression   . Diverticulosis   . Endometriosis   . Gastric ulcer 03/2014  . Gastritis   . Helicobacter pylori gastritis 2005  . Hiatal hernia   .  Hyperlipidemia   . Hypertension   . Internal hemorrhoids   . Stroke [facility])     Social History:  Social History   Socioeconomic History  . Marital status: Married    Spouse name: Not on file  . Number of children: Not on file  . Years of education: Not on file  . Highest education level: Not on file  Occupational History  . Not on file  Tobacco Use  . Smoking status: Current Every Day Smoker    Packs/day: 0.25    Types: Cigarettes  . Smokeless tobacco: Never Used  . Tobacco comment: smoke 4 per day  Vaping Use  . Vaping Use: Never used  Substance and Sexual Activity  . Alcohol use: No  . Drug use: No  . Sexual activity: Not on file  Other Topics Concern  . Not on file  Social History Narrative  . Not on file   Social Determinants of Health   Financial Resource Strain:   . Difficulty of Paying Living Expenses: Not on file  Food Insecurity:   . Worried About Charity fundraiser in the Last Year: Not on file  . Ran Out of Food in the Last Year: Not on file  Transportation Needs:   . Lack of Transportation (Medical): Not on file  . Lack of Transportation (Non-Medical): Not on file  Physical Activity:   . Days of Exercise per Week: Not on file  . Minutes of Exercise per Session: Not on file  Stress:   . Feeling of Stress : Not on file  Social Connections:   . Frequency of Communication with Friends and Family: Not on file  . Frequency of Social Gatherings with Friends and Family: Not on file  . Attends Religious Services: Not on file  . Active Member of Clubs or Organizations: Not on file  . Attends Archivist Meetings: Not on file  . Marital Status: Not on file  Intimate Partner Violence:   . Fear of Current or Ex-Partner: Not on file  . Emotionally Abused: Not on file  . Physically Abused: Not on file  . Sexually Abused: Not on file    Medications:   Current Outpatient Medications on File Prior to Visit  Medication Sig Dispense Refill  .  amLODipine (NORVASC) 10 MG tablet Take 10 mg by mouth daily.    . Ascorbic Acid (VITAMIN C) 250 MG CHEW     . aspirin EC 81 MG tablet Take 1 tablet (81 mg total) by mouth daily. Swallow whole. 30 tablet 11  . calcium carbonate (OS-CAL) 1250 (500 Ca) MG chewable tablet Chew 1 tablet by mouth daily.    . cariprazine (VRAYLAR) capsule Take by mouth.    . Lactobacillus (CULTURELLE DIGESTIVE WOMENS PO) Take by mouth.    Marland Kitchen lisinopril (ZESTRIL) 40 MG tablet Take 40 mg by mouth 2 (two) times daily.    . rosuvastatin (CRESTOR) 40 MG tablet Take 40 mg by mouth daily.    . sertraline (ZOLOFT) 100 MG tablet Take 100 mg by mouth daily.    . vitamin B-12 (CYANOCOBALAMIN) 1000 MCG tablet Take 1,000 mcg by mouth daily.    . vitamin C (ASCORBIC ACID) 500 MG tablet Take 500 mg  by mouth daily.     No current facility-administered medications on file prior to visit.    Allergies:   Allergies  Allergen Reactions  . Doxycycline   . Hydromorphone   . Penicillins Swelling     Today's Vitals   10/29/19 0850  BP: 140/82  Pulse: 71  Weight: 131 lb (59.4 kg)  Height: 5\' 3"  (1.6 m)   Body mass index is 23.21 kg/m.  Depression screen [facility] 2/9 10/29/2019  Decreased Interest 1  Down, Depressed, Hopeless 1  PHQ - 2 Score 2  Altered sleeping 0  Tired, decreased energy 3  Change in appetite 0  Feeling bad or failure about yourself  0  Trouble concentrating 2  Moving slowly or fidgety/restless 2  Suicidal thoughts 0  PHQ-9 Score 9  Difficult doing work/chores Somewhat difficult     Physical Exam General: well developed, well nourished pleasant middle-aged Caucasian lady, seated, in no evident distress Neck: supple with no carotid or supraclavicular bruits Cardiovascular: regular rate and rhythm, no murmurs Vascular:  Normal pulses all extremities  Neurologic Exam Mental Status: Awake and fully alert.  Fluent speech and language.  Oriented to place and time. Recent and remote memory intact.  Attention span, concentration and fund of knowledge appropriate. Mood and affect appropriate.  Cranial Nerves: Pupils equal, briskly reactive to light. Extraocular movements full without nystagmus. Visual fields full to confrontation. Hearing intact. Facial sensation intact. Face, tongue, palate moves normally and symmetrically.  Motor: Normal bulk and tone.  Positive pronator drift LUE with decreased hand dexterity.  Full strength all other tested extremities. Sensory.: intact to touch , pinprick , position and vibratory sensation.  Coordination: Rapid alternating movements normal in all extremities except decreased left hand. Finger-to-nose and heel-to-shin performed accurately bilaterally.  Mild BUE intention tremor Gait and Station: Arises from chair without difficulty. Stance is normal. Gait demonstrates normal stride length with mild imbalance greater with making turns, tandem walk and standing on single leg alone. Reflexes: 1+ and symmetric. Toes downgoing.        ASSESSMENT/PLAN: 59 year old Caucasian lady with left hemiparesis in April 2021 likely due to small right subcortical infarct not visualized on CT scan.  Vascular risk factors of smoking, hypertension , hyperlipidemia and silent cerebrovascular disease    Excessive daytime fatigue -Long discussion regarding possible underlying etiology and further evaluation including but not limited to underlying sleep apnea, poststroke fatigue, metabolic etiology, depression/anxiety or medication side effect -referral placed to [facility] to evaluate for potential sleep apnea -chronic bilateral infarcts on imaging, hx of uncontrolled HTN and HLD, excessive daytime fatigue, snoring and nocturia -lab work including CBC, CMP, TSH and B12 -She was advised to speak further to PCP regarding underlying depression/anxiety and cardiology for possible medication side effect for further evaluation if sleep apnea testing negative -fatigue severity  scale 63 -Epworth Sleepiness Scale 5 -PHQ-9 9  Hx of right subcortical infarct not visualized on CT -MRI brain w/wo and MRA head completed at [facility] on 10/17/2019 which showed chronic involutional changes with chronic bilateral lacunar infarcts without acute infarct, hemorrhage, edema or mass identified; no significant areas of stenosis, aneurysm formation or occlusion are noted, small bilateral mastoid effusions.   -Imaging impression did not specify location of prior strokes nor am I able to see prior imaging to compare.  Will provide MRI disc to Dr. Leonie Man for further review once he returns office -residual deficit: Mild LUE weakness -Continue aspirin 81 mg daily for secondary stroke  prevention -continue Crestor 40 mg daily at this time but will further look into Repatha due to history of statin myalgias and uncontrolled HLD on Crestor alone -Ensure close PCP/cardiology follow-up for aggressive stroke risk factor management  HLD -LDL goal<70 -Lipid panel 08/2019 showed LDL 197 despite Crestor 40 mg daily compliance -we will further look into initiating Repatha as prior note stated PA pending  HTN -BP goal<130/90 -Stable -Managed by PCP  Tobacco use -highly encouraged complete cessation    Follow-up will be determined after completion of sleep study if sleep apnea found, okay to combine initial CPAP compliance visit and stroke follow-up     I spent 40 minutes of face-to-face and non-face-to-face time with patient and husband.  This included previsit chart review, lab review, study review, order entry, electronic health record documentation, patient education regarding excessive daytime fatigue and possible causes, further evaluation and possible need of further evaluation by PCP/cardiology, review of recent MRI, importance of managing stroke risk factors and answered all questions to patient and husband satisfaction  Frann Rider, AGNP-BC  [facility] [address] [place], [place] [address]  Phone [phone] Fax [phone] Note: This document was prepared with digital dictation and possible smart phrase technology. Any transcriptional errors that result from this process are unintentional.

## 2019-10-29 NOTE — Patient Instructions (Signed)
Referral placed to [facility] for evaluation for possible underlying sleep apnea  We will also check lab work to rule out reversible causes  If negative for sleep apnea, would recommend further evaluation with PCP and cardiology for other contributing factors  Continue aspirin 81 mg daily for secondary stroke prevention  Continue Crestor at this time but we will further look into initiating Repatha for uncontrolled cholesterol  Continue to follow with cardiology for blood pressure monitoring and management  Continue to follow up with PCP regarding cholesterol and blood pressure management  Maintain strict control of hypertension with blood pressure goal below 130/90 and cholesterol with LDL cholesterol (bad cholesterol) goal below 70 mg/dL.    Follow-up will be determined after completion of sleep study          Thank you for coming to see [place] at [facility]. I hope we have been able to provide you high quality care today.  You may receive a patient satisfaction survey over the next few weeks. We would appreciate your feedback and comments so that we may continue to improve ourselves and the health of our patients.    Sleep Apnea Sleep apnea is a condition in which breathing pauses or becomes shallow during sleep. Episodes of sleep apnea usually last 10 seconds or longer, and they may occur as many as 20 times an hour. Sleep apnea disrupts your sleep and keeps your body from getting the rest that it needs. This condition can increase your risk of certain health problems, including:  Heart attack.  Stroke.  Obesity.  Diabetes.  Heart failure.  Irregular heartbeat. What are the causes? There are three kinds of sleep apnea:  Obstructive sleep apnea. This kind is caused by a blocked or collapsed airway.  Central sleep apnea. This kind happens when the part of the brain that controls breathing does not send the correct signals to the muscles that  control breathing.  Mixed sleep apnea. This is a combination of obstructive and central sleep apnea. The most common cause of this condition is a collapsed or blocked airway. An airway can collapse or become blocked if:  Your throat muscles are abnormally relaxed.  Your tongue and tonsils are larger than normal.  You are overweight.  Your airway is smaller than normal. What increases the risk? You are more likely to develop this condition if you:  Are overweight.  Smoke.  Have a smaller than normal airway.  Are elderly.  Are female.  Drink alcohol.  Take sedatives or tranquilizers.  Have a family history of sleep apnea. What are the signs or symptoms? Symptoms of this condition include:  Trouble staying asleep.  Daytime sleepiness and tiredness.  Irritability.  Loud snoring.  Morning headaches.  Trouble concentrating.  Forgetfulness.  Decreased interest in sex.  Unexplained sleepiness.  Mood swings.  Personality changes.  Feelings of depression.  Waking up often during the night to urinate.  Dry mouth.  Sore throat. How is this diagnosed? This condition may be diagnosed with:  A medical history.  A physical exam.  A series of tests that are done while you are sleeping (sleep study). These tests are usually done in a sleep lab, but they may also be done at home. How is this treated? Treatment for this condition aims to restore normal breathing and to ease symptoms during sleep. It may involve managing health issues that can affect breathing, such as high blood pressure or obesity. Treatment may include:  Sleeping on  your side.  Using a decongestant if you have nasal congestion.  Avoiding the use of depressants, including alcohol, sedatives, and narcotics.  Losing weight if you are overweight.  Making changes to your diet.  Quitting smoking.  Using a device to open your airway while you sleep, such as: ? An oral appliance. This is a  custom-made mouthpiece that shifts your lower jaw forward. ? A continuous positive airway pressure (CPAP) device. This device blows air through a mask when you breathe out (exhale). ? A nasal expiratory positive airway pressure (EPAP) device. This device has valves that you put into each nostril. ? A bi-level positive airway pressure (BPAP) device. This device blows air through a mask when you breathe in (inhale) and breathe out (exhale).  Having surgery if other treatments do not work. During surgery, excess tissue is removed to create a wider airway. It is important to get treatment for sleep apnea. Without treatment, this condition can lead to:  High blood pressure.  Coronary artery disease.  In men, an inability to achieve or maintain an erection (impotence).  Reduced thinking abilities. Follow these instructions at home: Lifestyle  Make any lifestyle changes that your health care provider recommends.  Eat a healthy, well-balanced diet.  Take steps to lose weight if you are overweight.  Avoid using depressants, including alcohol, sedatives, and narcotics.  Do not use any products that contain nicotine or tobacco, such as cigarettes, e-cigarettes, and chewing tobacco. If you need help quitting, ask your health care provider. General instructions  Take over-the-counter and prescription medicines only as told by your health care provider.  If you were given a device to open your airway while you sleep, use it only as told by your health care provider.  If you are having surgery, make sure to tell your health care provider you have sleep apnea. You may need to bring your device with you.  Keep all follow-up visits as told by your health care provider. This is important. Contact a health care provider if:  The device that you received to open your airway during sleep is uncomfortable or does not seem to be working.  Your symptoms do not improve.  Your symptoms get worse. Get  help right away if:  You develop: ? Chest pain. ? Shortness of breath. ? Discomfort in your back, arms, or stomach.  You have: ? Trouble speaking. ? Weakness on one side of your body. ? Drooping in your face. These symptoms may represent a serious problem that is an emergency. Do not wait to see if the symptoms will go away. Get medical help right away. Call your local emergency services (911 in the [place] Do not drive yourself to the hospital. Summary  Sleep apnea is a condition in which breathing pauses or becomes shallow during sleep.  The most common cause is a collapsed or blocked airway.  The goal of treatment is to restore normal breathing and to ease symptoms during sleep. This information is not intended to replace advice given to you by your health care provider. Make sure you discuss any questions you have with your health care provider. Document Revised: 08/09/2018 Document Reviewed: 10/18/2017 Elsevier Patient Education  [place].

## 2019-10-30 ENCOUNTER — Telehealth: Payer: Self-pay | Admitting: Adult Health

## 2019-10-30 DIAGNOSIS — E785 Hyperlipidemia, unspecified: Secondary | ICD-10-CM

## 2019-10-30 DIAGNOSIS — Z789 Other specified health status: Secondary | ICD-10-CM

## 2019-10-30 LAB — COMPREHENSIVE METABOLIC PANEL
ALT: 15 IU/L (ref 0–32)
AST: 22 IU/L (ref 0–40)
Albumin/Globulin Ratio: 2 (ref 1.2–2.2)
Albumin: 4.5 g/dL (ref 3.8–4.8)
Alkaline Phosphatase: 96 IU/L (ref 48–121)
BUN/Creatinine Ratio: 14 (ref 12–28)
BUN: 10 mg/dL (ref 8–27)
Bilirubin Total: 0.3 mg/dL (ref 0.0–1.2)
CO2: 25 mmol/L (ref 20–29)
Calcium: 9.4 mg/dL (ref 8.7–10.3)
Chloride: 106 mmol/L (ref 96–106)
Creatinine, Ser: 0.73 mg/dL (ref 0.57–1.00)
GFR calc Af Amer: 103 mL/min/{1.73_m2} (ref >59)
GFR calc non Af Amer: 89 mL/min/{1.73_m2} (ref >59)
Globulin, Total: 2.3 g/dL (ref 1.5–4.5)
Glucose: 90 mg/dL (ref 65–99)
Potassium: 4.3 mmol/L (ref 3.5–5.2)
Sodium: 146 mmol/L — ABNORMAL HIGH (ref 134–144)
Total Protein: 6.8 g/dL (ref 6.0–8.5)

## 2019-10-30 LAB — CBC
Hematocrit: 44.9 % (ref 34.0–46.6)
Hemoglobin: 15.3 g/dL (ref 11.1–15.9)
MCH: 32.7 pg (ref 26.6–33.0)
MCHC: 34.1 g/dL (ref 31.5–35.7)
MCV: 96 fL (ref 79–97)
Platelets: 272 10*3/uL (ref 150–450)
RBC: 4.68 x10E6/uL (ref 3.77–5.28)
RDW: 13 % (ref 11.7–15.4)
WBC: 10.2 10*3/uL (ref 3.4–10.8)

## 2019-10-30 LAB — VITAMIN B12: Vitamin B-12: >2000 pg/mL — ABNORMAL HIGH (ref 232–1245)

## 2019-10-30 LAB — TSH: TSH: 0.504 u[IU]/mL (ref 0.450–4.500)

## 2019-10-30 MED ORDER — REPATHA SURECLICK 140 MG/ML ~~LOC~~ SOAJ: 140.0000 mg | SUBCUTANEOUS | 4 refills | Status: DC

## 2019-10-30 NOTE — Telephone Encounter (Signed)
PA denied Spoke to PHARMACY they stated with her ins PA not needed  There is a $50 copay,  Pt may be eligible for $5 copay card   Spoke to Cyd Silence  Explained how to apply for copay card  They will try to sign up for that

## 2019-10-30 NOTE — Telephone Encounter (Signed)
The relpatha did you mean to have 2 syringes every 30 days.  (taking one every 14 days).  On hold for long time with pharmacy finally had to hang up (asking for clarification) I think it was on that.

## 2019-10-30 NOTE — Telephone Encounter (Signed)
What was the reason for denial?

## 2019-10-30 NOTE — Telephone Encounter (Signed)
Noted! Thank you

## 2019-10-30 NOTE — Addendum Note (Signed)
Addended by: Mal Misty on: 10/30/2019 11:52 AM   Modules accepted: Orders

## 2019-10-30 NOTE — Telephone Encounter (Signed)
Her ins stated pa was not needed they already cover the medication, her copay is $50.  But she can use copay card for 1 year, and it can be renewed yearly.

## 2019-10-30 NOTE — Telephone Encounter (Signed)
[email or web address] [phone] - is asking for a call to clarify the script for pt's Evolocumab (REPATHA SURECLICK) 628 MG/ML SOAJ

## 2019-11-06 NOTE — Progress Notes (Signed)
I agree with the above plan 

## 2019-12-03 ENCOUNTER — Ambulatory Visit (INDEPENDENT_AMBULATORY_CARE_PROVIDER_SITE_OTHER): Payer: BC Managed Care – PPO | Admitting: Neurology

## 2019-12-03 ENCOUNTER — Other Ambulatory Visit: Payer: Self-pay

## 2019-12-03 ENCOUNTER — Encounter: Payer: Self-pay | Admitting: Neurology

## 2019-12-03 VITALS — BP 126/92 | HR 96 | Ht 61.0 in | Wt 126.0 lb

## 2019-12-03 DIAGNOSIS — Z8673 Personal history of transient ischemic attack (TIA), and cerebral infarction without residual deficits: Secondary | ICD-10-CM | POA: Diagnosis not present

## 2019-12-03 DIAGNOSIS — R5383 Other fatigue: Secondary | ICD-10-CM

## 2019-12-03 DIAGNOSIS — R0683 Snoring: Secondary | ICD-10-CM

## 2019-12-03 DIAGNOSIS — R351 Nocturia: Secondary | ICD-10-CM

## 2019-12-03 NOTE — Patient Instructions (Signed)

## 2019-12-03 NOTE — Progress Notes (Signed)
Subjective:    Patient ID: Nicole Scott is a 59 y.o. female.  HPI     Star Age, MD, PhD [facility] [address] [place], [place] [address]  Dear Janett Billow and Mamie Nick,   I saw  your patient, Nicole Scott, upon your kind request, in my sleep clinic today for initial consultation of her sleep disorder, in particular, concern for underlying obstructive sleep apnea.  The patient is unaccompanied today.  As you know, Nicole Scott is a 59 year old right-handed woman with an underlying medical history of hypertension, hyperlipidemia, stroke, diverticulosis, history of gastritis and gastric ulcer, endometriosis, depression, and anxiety, who reports snoring and daytime tiredness, especially since her stroke in April 2021.  She has fatigue as opposed to frank sleepiness.  In fact, her Epworth sleepiness score is 0 out of 24, fatigue severity score is 52 out of 63.  I reviewed your office note from 10/29/2019. She is retired, she used to work in Therapist, art.  She quit smoking in May 2021.  She drinks caffeine in the form of sweet tea, 1 serving per day on average.  She sees a cardiologist on a regular basis in [place].  She has a bedtime of around 11 and rise time between 8 and 8:30 AM.  She has nocturia about once per average night, denies recurrent morning headaches or family history of sleep apnea.  She is familiar with the sleep apnea diagnosis as her husband has sleep apnea.  She drinks alcohol occasionally.  She lives with her husband and 39 year old daughter who is a kidney recipient, patient donated her kidney.  She lost her only son some 6 years ago.  She has 3 biological grandchildren, ages 58, 67 and 35 and 3 step grandchildren.  They have a dog in the household, dog does not sleep in the bedroom with them.  They do have a TV in the bedroom but she does not watch it at night.   Her Past Medical History Is Significant For: Past Medical History:  Diagnosis  Date  . Anxiety   . Depression   . Diverticulosis   . Endometriosis   . Gastric ulcer 03/2014  . Gastritis   . Helicobacter pylori gastritis 2005  . Hiatal hernia   . Hyperlipidemia   . Hypertension   . Internal hemorrhoids   . Migraine   . Stroke [facility])     Her Past Surgical History Is Significant For: Past Surgical History:  Procedure Laterality Date  . CHOLECYSTECTOMY    . LAPAROSCOPIC HYSTERECTOMY    . NEPHRECTOMY Left     Her Family History Is Significant For: Family History  Problem Relation Age of Onset  . Stomach cancer Father   . Brain cancer Father   . Colon cancer Paternal Grandmother   . Breast cancer Maternal Aunt   . Colon polyps Brother   . Cancer Mother     Her Social History Is Significant For: Social History   Socioeconomic History  . Marital status: Married    Spouse name: Not on file  . Number of children: Not on file  . Years of education: Not on file  . Highest education level: Not on file  Occupational History  . Not on file  Tobacco Use  . Smoking status: Current Every Day Smoker    Packs/day: 0.25    Types: Cigarettes  . Smokeless tobacco: Never Used  . Tobacco comment: smoke 4 per day  Vaping Use  . Vaping  Use: Never used  Substance and Sexual Activity  . Alcohol use: No  . Drug use: No  . Sexual activity: Not on file  Other Topics Concern  . Not on file  Social History Narrative  . Not on file   Social Determinants of Health   Financial Resource Strain:   . Difficulty of Paying Living Expenses: Not on file  Food Insecurity:   . Worried About Charity fundraiser in the Last Year: Not on file  . Ran Out of Food in the Last Year: Not on file  Transportation Needs:   . Lack of Transportation (Medical): Not on file  . Lack of Transportation (Non-Medical): Not on file  Physical Activity:   . Days of Exercise per Week: Not on file  . Minutes of Exercise per Session: Not on file  Stress:   . Feeling of Stress : Not on file   Social Connections:   . Frequency of Communication with Friends and Family: Not on file  . Frequency of Social Gatherings with Friends and Family: Not on file  . Attends Religious Services: Not on file  . Active Member of Clubs or Organizations: Not on file  . Attends Archivist Meetings: Not on file  . Marital Status: Not on file    Her Allergies Are:  Allergies  Allergen Reactions  . Doxycycline   . Hydromorphone   . Penicillins Swelling  :   Her Current Medications Are:  Outpatient Encounter Medications as of 12/03/2019  Medication Sig  . amLODipine (NORVASC) 10 MG tablet Take 10 mg by mouth daily.  . Ascorbic Acid (VITAMIN C) 250 MG CHEW   . aspirin EC 81 MG tablet Take 1 tablet (81 mg total) by mouth daily. Swallow whole.  . calcium carbonate (OS-CAL) 1250 (500 Ca) MG chewable tablet Chew 1 tablet by mouth daily.  . cariprazine (VRAYLAR) capsule Take by mouth.  . Evolocumab (REPATHA SURECLICK) 341 MG/ML SOAJ Inject 140 mg into the skin every 14 (fourteen) days.  . Lactobacillus (CULTURELLE DIGESTIVE WOMENS PO) Take by mouth.  Marland Kitchen lisinopril (ZESTRIL) 40 MG tablet Take 40 mg by mouth 2 (two) times daily.  . rosuvastatin (CRESTOR) 40 MG tablet Take 40 mg by mouth daily.  . sertraline (ZOLOFT) 100 MG tablet Take 100 mg by mouth daily.  . vitamin B-12 (CYANOCOBALAMIN) 1000 MCG tablet Take 1,000 mcg by mouth daily.  . vitamin C (ASCORBIC ACID) 500 MG tablet Take 500 mg by mouth daily.   No facility-administered encounter medications on file as of 12/03/2019.  :  Review of Systems:  Out of a complete 14 point review of systems, all are reviewed and negative with the exception of these symptoms as listed below:  Review of Systems  Neurological:       Here for sleep consult. No prior sleep study. Pt reports hx of stroke and she confirms she does snore at night.  Epworth Sleepiness Scale 0= would never doze 1= slight chance of dozing 2= moderate chance of dozing 3=  high chance of dozing  Sitting and reading:0 Watching TV:0 Sitting inactive in a public place (ex. Theater or meeting):0 As a passenger in a car for an hour without a break:0 Lying down to rest in the afternoon:0 Sitting and talking to someone:0 Sitting quietly after lunch (no alcohol):0 In a car, while stopped in traffic:0 Total:0     Objective:  Neurological Exam  Physical Exam Physical Examination:   Vitals:   12/03/19 1322  BP: (!) 126/92  Pulse: 96  SpO2: 96%    General Examination: The patient is a very pleasant 59 y.o. female in no acute distress. She appears well-developed and well-nourished and well groomed.   HEENT: Normocephalic, atraumatic, pupils are equal, round and reactive to light, extraocular tracking is good without limitation to gaze excursion or nystagmus noted. Hearing is grossly intact. Face is symmetric with normal facial animation. Speech is slightly dysarthric sounding at times, no hypophonia or voice tremor.  Neck circumference is 14-1/2 inches. Neck is supple with full range of passive and active motion. There are no carotid bruits on auscultation. Oropharynx exam reveals: mild mouth dryness, adequate dental hygiene and mild airway crowding, due to airway entry.  Mallampati is class II, tonsils are small.  Tongue protrudes centrally in palate elevates symmetrically.  Chest: Clear to auscultation without wheezing, rhonchi or crackles noted.  Heart: S1+S2+0, regular and normal without murmurs, rubs or gallops noted.   Abdomen: Soft, non-tender and non-distended with normal bowel sounds appreciated on auscultation.  Extremities: There is no pitting edema in the distal lower extremities bilaterally.   Skin: Warm and dry without trophic changes noted.   Musculoskeletal: exam reveals no obvious joint deformities, tenderness or joint swelling or erythema.   Neurologically:  Mental status: The patient is awake, alert and oriented in all 4 spheres. Her  immediate and remote memory, attention, language skills and fund of knowledge are appropriate. There is no evidence of aphasia, agnosia, apraxia or anomia. Speech is clear with normal prosody and enunciation. Thought process is linear. Mood is normal and affect is normal.  Cranial nerves II - XII are as described above under HEENT exam.  Motor exam: Normal bulk, strength and tone is noted. There is no tremor, fine motor skills and coordination: grossly intact.  Cerebellar testing: No dysmetria or intention tremor. There is no truncal or gait ataxia.  Sensory exam: intact to light touch in the upper and lower extremities.  Gait, station and balance: She stands easily. No veering to one side is noted. No leaning to one side is noted. Posture is age-appropriate and stance is narrow based. Gait shows normal stride length and normal pace. No problems turning are noted.   Assessment and Plan:  In summary, Nicole Scott is a very pleasant 59 y.o.-year old female with an underlying medical history of hypertension, hyperlipidemia, stroke, diverticulosis, history of gastritis and gastric ulcer, endometriosis, depression, and anxiety, who presents for sleep evaluation.  She had a stroke in April 2021.  She does snore and reports daytime tiredness.  I had a long chat with the patient about the possibility that she may have underlying sleep disordered breathing.  I talked to her about the diagnosis of OSA, its prognosis and treatment options. We talked about medical treatments, surgical interventions and non-pharmacological approaches. I explained in particular the risks and ramifications of untreated moderate to severe OSA, especially with respect to developing cardiovascular disease down the Road, including congestive heart failure, difficult to treat hypertension, cardiac arrhythmias, or stroke. Even type 2 diabetes has, in part, been linked to untreated OSA. Symptoms of untreated OSA include daytime sleepiness, memory  problems, mood irritability and mood disorder such as depression and anxiety, lack of energy, as well as recurrent headaches, especially morning headaches. We talked about ongoing smoking cessation and trying to maintain a healthy lifestyle including secondary stroke prevention.  We also talked about the importance of good sleep hygiene. I recommended the following at this time: sleep  study.  I explained the sleep test procedure to the patient and also treatment options especially focusing on the possibility of using positive airway pressure.  We will pick up our conversation after testing.  I plan to see her back after her sleep study is completed.  I answered all her questions today and she was in agreement with the plan.  Thank you very much for allowing me to participate in the care of this nice patient. If I can be of any further assistance to you please do not hesitate to talk to me.   Sincerely,   Star Age, MD, PhD

## 2019-12-05 ENCOUNTER — Telehealth: Payer: Self-pay

## 2019-12-05 NOTE — Telephone Encounter (Signed)
LVM for pt to call me back to schedule sleep study  

## 2019-12-31 ENCOUNTER — Ambulatory Visit (INDEPENDENT_AMBULATORY_CARE_PROVIDER_SITE_OTHER): Payer: BC Managed Care – PPO | Admitting: Neurology

## 2019-12-31 DIAGNOSIS — Z8673 Personal history of transient ischemic attack (TIA), and cerebral infarction without residual deficits: Secondary | ICD-10-CM

## 2019-12-31 DIAGNOSIS — R0683 Snoring: Secondary | ICD-10-CM

## 2019-12-31 DIAGNOSIS — G4733 Obstructive sleep apnea (adult) (pediatric): Secondary | ICD-10-CM

## 2019-12-31 DIAGNOSIS — R5383 Other fatigue: Secondary | ICD-10-CM

## 2019-12-31 DIAGNOSIS — R351 Nocturia: Secondary | ICD-10-CM

## 2020-01-08 ENCOUNTER — Telehealth: Payer: Self-pay

## 2020-01-08 NOTE — Progress Notes (Signed)
Patient referred by Dr. Leonie Man, seen by me on 12/03/19, HST on 12/31/19.    Please call and notify the patient that the recent home sleep test showed obstructive sleep apnea in the moderate range. I recommend treatment in the form of an autoPAP machine at home, through a [place] (of her choice, or as per insurance requirement). The DME representative will educate her on how to use the machine, how to put the mask on, etc. I have placed an order in the chart. Please send referral, talk to patient, send report to referring MD. We will need a FU in sleep clinic for 10 weeks post-PAP set up, please arrange that with me or one of our NPs. Thanks,   Star Age, MD, PhD [facility])

## 2020-01-08 NOTE — Telephone Encounter (Signed)
-----   Message from Star Age, MD sent at 01/08/2020  7:14 AM EDT ----- Patient referred by Dr. Leonie Man, seen by me on 12/03/19, HST on 12/31/19.    Please call and notify the patient that the recent home sleep test showed obstructive sleep apnea in the moderate range. I recommend treatment in the form of an autoPAP machine at home, through a [place] (of her choice, or as per insurance requirement). The DME representative will educate her on how to use the machine, how to put the mask on, etc. I have placed an order in the chart. Please send referral, talk to patient, send report to referring MD. We will need a FU in sleep clinic for 10 weeks post-PAP set up, please arrange that with me or one of our NPs. Thanks,   Star Age, MD, PhD [facility])

## 2020-01-08 NOTE — Addendum Note (Signed)
Addended by: Star Age on: 01/08/2020 07:14 AM   Modules accepted: Orders

## 2020-01-08 NOTE — Telephone Encounter (Signed)
I called pt. I advised pt that Dr. Rexene Alberts reviewed their sleep study results and found that pt has moderate osa. Dr. Rexene Alberts recommends that pt start an auto pap at home. I reviewed PAP compliance expectations with the pt. Pt is agreeable to starting an auto-PAP. I advised pt that an order will be sent to a DME, [facility], and [facility] will call the pt within about one week after they file with the pt's insurance. [facility] will show the pt how to use the machine, fit for masks, and troubleshoot the auto-PAP if needed. A follow up appt was made for insurance purposes with Amy, NP on 04/22/20 at 1:30pm. Pt verbalized understanding to arrive 15 minutes early and bring their auto-PAP. A letter with all of this information in it will be mailed to the pt as a reminder. I verified with the pt that the address we have on file is correct. Pt verbalized understanding of results. Pt had no questions at this time but was encouraged to call back if questions arise. I have sent the order to [facility] and have received confirmation that they have received the order.

## 2020-01-08 NOTE — Procedures (Signed)
   Nicole Scott  HOME SLEEP TEST (Watch PAT)  STUDY DATE:12/31/19  DOB: [birth year]  MRN: [id number]  ORDERING CLINICIAN: Star Age, MD, PhD   REFERRING CLINICIAN: Dr. Leonie Man CLINICAL INFORMATION/HISTORY: 59 year old right-handed woman with an underlying medical history of hypertension, hyperlipidemia, stroke, diverticulosis, history of gastritis and gastric ulcer, endometriosis, depression, and anxiety, who reports snoring and daytime tiredness, especially since her stroke in April 2021.  She has fatigue as opposed to frank sleepiness.  Epworth sleepiness score: 0/24.  BMI: 23.7 kg/m  FINDINGS:   Total Record Time (hours, min): 8h,43minTotal Sleep Time (hours, min):  7h,64min Percent REM (%):    19.6   Calculated pAHI (per hour):  20.9      REM pAHI:    27.3  NREM pAHI: 19.3  Pulse Mean (bpm):    69 Pulse Range (59-99)    Oxygen Saturation (%) Mean: 91         Minimum oxygen saturation (%):         85   O2 Saturation Range (%): 85-100 O2Saturation (minutes) <=88%: 2.1  IMPRESSION: OSA (obstructive sleep apnea), moderate  RECOMMENDATION:  This home sleep test demonstrates moderate obstructive sleep apnea with a total AHI of 20.9/hour and O2 nadir of 85%. Treatment with positive airway pressure is recommended. The patient will be advised to proceed with an autoPAP titration/trial at home for now. Please note that untreated obstructive sleep apnea may carry additional perioperative morbidity. Patients with significant obstructive sleep apnea should receive perioperative PAP therapy and the surgeons and particularly the anesthesiologist should be informed of the diagnosis and the severity of the sleep disordered breathing. The patient should be cautioned not to drive, work at heights, or operate dangerous or heavy equipment when tired or sleepy. Review and reiteration of good sleep hygiene measures should be pursued with any patient. Other causes of the patient's  symptoms, including circadian rhythm disturbances, an underlying mood disorder, medication effect and/or an underlying medical problem cannot be ruled out based on this test. Clinical correlation is recommended. The patient and her referring provider will be notified of the test results. The patient will be seen in follow up in sleep clinic at [facility].  I certify that I have reviewed the raw data recording prior to the issuance of this report in accordance with the standards of the American Academy of Sleep Medicine (AASM).  INTERPRETING PHYSICIAN:    Star Age, MD, PhD  Certified in Neurology, Sleep Medicine  [facility] [address] [place], [place] [address] [phone]

## 2020-01-08 NOTE — Telephone Encounter (Signed)
It appears that Janett Billow, NP is already seeing pt and therefore her f/u should be with Janett Billow, NP. I called pt to discuss. No answer, left a message asking her to call me back. If pt calls back please advise her of this and help her change her appt to [place], NP's schedule.

## 2020-04-22 ENCOUNTER — Ambulatory Visit: Payer: BC Managed Care – PPO | Admitting: Family Medicine

## 2020-05-12 ENCOUNTER — Telehealth: Payer: Self-pay

## 2020-05-12 NOTE — Telephone Encounter (Signed)
Called pt regarding initial autoPAP FU  With [facility] tomorrow 05/13/20. Pt has not received autoPAP yet and didn't have a date when she would receive it. Advised to cancel or rescheduled appointment with 30 days of usage, pt states she will give [place] a call back when she receives device to schedule FU.

## 2020-05-13 ENCOUNTER — Ambulatory Visit: Payer: BC Managed Care – PPO | Admitting: Adult Health

## 2020-06-18 ENCOUNTER — Encounter: Payer: Self-pay | Admitting: Gastroenterology

## 2020-06-18 ENCOUNTER — Ambulatory Visit (INDEPENDENT_AMBULATORY_CARE_PROVIDER_SITE_OTHER): Payer: BC Managed Care – PPO | Admitting: Gastroenterology

## 2020-06-18 ENCOUNTER — Other Ambulatory Visit: Payer: Self-pay | Admitting: Gastroenterology

## 2020-06-18 ENCOUNTER — Other Ambulatory Visit: Payer: BC Managed Care – PPO

## 2020-06-18 VITALS — BP 148/76 | HR 84 | Ht 61.0 in | Wt 131.0 lb

## 2020-06-18 DIAGNOSIS — K219 Gastro-esophageal reflux disease without esophagitis: Secondary | ICD-10-CM | POA: Diagnosis not present

## 2020-06-18 DIAGNOSIS — R112 Nausea with vomiting, unspecified: Secondary | ICD-10-CM

## 2020-06-18 DIAGNOSIS — R1110 Vomiting, unspecified: Secondary | ICD-10-CM | POA: Insufficient documentation

## 2020-06-18 DIAGNOSIS — R1013 Epigastric pain: Secondary | ICD-10-CM | POA: Diagnosis not present

## 2020-06-18 MED ORDER — ONDANSETRON HCL 4 MG PO TABS: 4.0000 mg | ORAL_TABLET | Freq: Three times a day (TID) | ORAL | 0 refills | Status: DC | PRN

## 2020-06-18 MED ORDER — OMEPRAZOLE 40 MG PO CPDR: 40.0000 mg | DELAYED_RELEASE_CAPSULE | Freq: Two times a day (BID) | ORAL | 2 refills | Status: DC

## 2020-06-18 MED ORDER — FAMOTIDINE 20 MG PO TABS: 20.0000 mg | ORAL_TABLET | Freq: Two times a day (BID) | ORAL | 1 refills | Status: DC

## 2020-06-18 NOTE — Patient Instructions (Addendum)
If you are age 62 or older, your body mass index should be between 23-30. Your Body mass index is 24.75 kg/m. If this is out of the aforementioned range listed, please consider follow up with your Primary Care Provider.  If you are age 20 or younger, your body mass index should be between 19-25. Your Body mass index is 24.75 kg/m. If this is out of the aformentioned range listed, please consider follow up with your Primary Care Provider.   Your provider has requested that you go to the basement level for lab work before leaving today. Press "B" on the elevator. The lab is located at the first door on the left as you exit the elevator.  Due to recent changes in healthcare laws, you may see the results of your imaging and laboratory studies on MyChart before your provider has had a chance to review them.  We understand that in some cases there may be results that are confusing or concerning to you. Not all laboratory results come back in the same time frame and the provider may be waiting for multiple results in order to interpret others.  Please give [place] 48 hours in order for your provider to thoroughly review all the results before contacting the office for clarification of your results.   Thank you for trusting me with your gastrointestinal care!    Alonza Bogus, PA- C

## 2020-06-18 NOTE — Progress Notes (Addendum)
06/18/2020 Nicole Scott [id number] [birth year]   HISTORY OF PRESENT ILLNESS: This is a 60 year old female is a patient of Dr. Vena Rua, seen by him once only in November 2020 and then he performed EGD and colonoscopy also in November 2020.  The results as well as previous GI evaluation are listed below.  Nonetheless, she has past medical history of GERD, hiatal hernia, H. pylori gastritis, diverticulosis, family history of gastric and colon cancer.  She is currently on omeprazole 20 mg over-the-counter twice daily.  She tells me that she was feeling well until about 3 weeks ago when she started developing epigastric abdominal pain.  She thinks this began after eating some lasagna.  She feels like the pain is worse after eating and her upper abdomen becomes distended.  She also has some nausea and a couple episodes of vomiting each week since this began.  She is concerned about ulcers or Hpylori.  She tells me that she had Hpylori 3 times before.  She has not had any weight loss, in fact her weight is up 9 pounds from when we saw her last.  Colonoscopy November 2020 with Dr. Hilarie Fredrickson showed 5 polyps that were removed, the greatest was 5 mm in size.  She has had diverticulosis.  EGD November 2020 with Dr. Hilarie Fredrickson showed a normal esophagus, no cause for complaints of dysphagia.  She was found to have gastritis.  Pathology from EGD and colonoscopy 01/2019:  1. Surgical [P], gastric antrum and body - MILD CHRONIC GASTRITIS WITH INTESTINAL METAPLASIA - NO H. PYLORI IDENTIFIED - SEE COMMENT 2. Surgical [P], ileocecal valve x 1, transverse x 2, polyp (3) - TUBULAR ADENOMA (3 OF 6 FRAGMENTS) - BENIGN COLONIC MUCOSA (3 OF 6 FRAGMENTS) - NO HIGH GRADE DYSPLASIA OR MALIGNANCY IDENTIFIED 3. Surgical [P], colon, sigmoid and descending, polyp (2) - TUBULAR ADENOMA (2 OF 2 FRAGMENTS) - NO HIGH GRADE DYSPLASIA OR MALIGNANCY IDENTIFIED   Colonoscopy dated 11/06/2018 --poor preparation.  Stool throughout the  colon particularly in the right limited examination.  Though it is not mentioned there is evidence of diverticulosis in the sigmoid by pictures from this procedure.  EGD dated 08/14/2018 --hiatal hernia, antral erosions, erythema and erosions in the duodenum with duodenitis  Ultrasound abdomen complete 08/11/2018 --status post cholecystectomy with mild dilatation of the common bile duct likely due to reservoir effect.  Liver normal in size and echotexture.  Normal spleen  Gastric emptying scan 03/31/2017 --normal  Pathology results reviewed from prior procedures: January 2016 --gastric biopsies surface erosion and chronic inflammation of the antrum.  No H. pylori October 2018 -mild chronic gastritis no H. Pylori March 2019 --mild chronic gastropathy chronic gastritis.  No H. Pylori; mildly acanthotic stratified squamous mucosa without pathologic change distal esophagus February 2019 --nonspecific mild chronic inflammation of the sigmoid. June 2020 superficial erosion and chronic inflammation of gastric mucosa.  H. pylori negative.  Duodenal biopsy no pathologic change   Past Medical History:  Diagnosis Date  . Anxiety   . Depression   . Diverticulosis   . Endometriosis   . Gastric ulcer 03/2014  . Gastritis   . Helicobacter pylori gastritis 2005  . Hiatal hernia   . Hyperlipidemia   . Hypertension   . Internal hemorrhoids   . Migraine   . Stroke [facility])    Past Surgical History:  Procedure Laterality Date  . CHOLECYSTECTOMY    . LAPAROSCOPIC HYSTERECTOMY    . NEPHRECTOMY Left     reports that she  has been smoking cigarettes. She has been smoking about 0.25 packs per day. She has never used smokeless tobacco. She reports that she does not drink alcohol and does not use drugs. family history includes Brain cancer in her father; Breast cancer in her maternal aunt; Cancer in her mother; Colon cancer in her paternal grandmother; Colon polyps in her brother; Stomach cancer in her  father. Allergies  Allergen Reactions  . Doxycycline   . Hydromorphone   . Penicillins Swelling      Outpatient Encounter Medications as of 06/18/2020  Medication Sig  . amLODipine (NORVASC) 10 MG tablet Take 10 mg by mouth daily.  . Ascorbic Acid (VITAMIN C) 250 MG CHEW   . aspirin EC 81 MG tablet Take 1 tablet (81 mg total) by mouth daily. Swallow whole.  . calcium carbonate (OS-CAL) 1250 (500 Ca) MG chewable tablet Chew 1 tablet by mouth daily.  . cariprazine (VRAYLAR) capsule Take by mouth.  . Evolocumab (REPATHA SURECLICK) 947 MG/ML SOAJ Inject 140 mg into the skin every 14 (fourteen) days.  . Lactobacillus (CULTURELLE DIGESTIVE WOMENS PO) Take by mouth.  Marland Kitchen lisinopril (ZESTRIL) 40 MG tablet Take 40 mg by mouth 2 (two) times daily.  . sertraline (ZOLOFT) 100 MG tablet Take 100 mg by mouth daily.  . vitamin B-12 (CYANOCOBALAMIN) 1000 MCG tablet Take 1,000 mcg by mouth daily.  . vitamin C (ASCORBIC ACID) 500 MG tablet Take 500 mg by mouth daily.  . [DISCONTINUED] rosuvastatin (CRESTOR) 40 MG tablet Take 40 mg by mouth daily.   No facility-administered encounter medications on file as of 06/18/2020.     REVIEW OF SYSTEMS  : All other systems reviewed and negative except where noted in the History of Present Illness.   PHYSICAL EXAM: BP (!) 148/76   Pulse 84   Ht 5\' 1"  (1.549 m)   Wt 131 lb (59.4 kg)   BMI 24.75 kg/m  General: Well developed white female in no acute distress Head: Normocephalic and atraumatic Eyes:  Sclerae anicteric, conjunctiva pink. Ears: Normal auditory acuity Lungs: Clear throughout to auscultation; no W/R/R. Heart: Regular rate and rhythm; no M/R/G. Abdomen: Soft, non-distended.  BS present.  Epigastric TTP. Musculoskeletal: Symmetrical with no gross deformities  Skin: No lesions on visible extremities Extremities: No edema  Neurological: Alert oriented x 4, grossly non-focal Psychological:  Alert and cooperative. Normal mood and  affect  ASSESSMENT AND PLAN: *60 year old female with complaints of epigastric abdominal pain with some nausea and vomiting for the past 2 to 3 weeks.  She has history of GERD, gastritis, and H. pylori.  She is on omeprazole 20 mg twice daily.  EGD November 2020 showed gastritis, biopsies negative for H. pylori.  She remains concerned about recurrent H. pylori infection.  I am going to have her discontinue her PPI for 2 weeks and then have her collect a stool study for H. pylori antigen.  In the interim I am going to place her on Pepcid 20 mg twice daily, which she will only need to discontinue 24 hours prior to collecting her stool sample in 2 weeks.  Once she collects her stool sample then we will increase her PPI to omeprazole 40 mg twice daily for several weeks.  I am going to give her some zofran to use prn as well.  Prescriptions sent to pharmacy.   CC:  Frances Maywood, FNP

## 2020-06-24 NOTE — Progress Notes (Signed)
Addendum: Reviewed and agree with assessment and management plan. Glorian Mcdonell M, MD  

## 2020-06-27 ENCOUNTER — Telehealth: Payer: Self-pay

## 2020-06-27 NOTE — Telephone Encounter (Signed)
PA for BID omeprazole 40 submitted with cover my meds  Key - [id number]

## 2020-07-01 ENCOUNTER — Other Ambulatory Visit: Payer: BC Managed Care – PPO

## 2020-07-01 DIAGNOSIS — K219 Gastro-esophageal reflux disease without esophagitis: Secondary | ICD-10-CM

## 2020-07-01 DIAGNOSIS — R1013 Epigastric pain: Secondary | ICD-10-CM

## 2020-07-01 DIAGNOSIS — R112 Nausea with vomiting, unspecified: Secondary | ICD-10-CM

## 2020-07-02 LAB — HELICOBACTER PYLORI  SPECIAL ANTIGEN
MICRO NUMBER:: 11815288
SPECIMEN QUALITY: ADEQUATE

## 2020-07-02 NOTE — Telephone Encounter (Signed)
Awaiting insurance response.

## 2020-07-18 ENCOUNTER — Other Ambulatory Visit: Payer: Self-pay | Admitting: Gastroenterology

## 2020-08-21 NOTE — Telephone Encounter (Signed)
error 

## 2020-09-11 ENCOUNTER — Encounter: Payer: Self-pay | Admitting: Gastroenterology

## 2020-09-11 ENCOUNTER — Other Ambulatory Visit (INDEPENDENT_AMBULATORY_CARE_PROVIDER_SITE_OTHER): Payer: BC Managed Care – PPO

## 2020-09-11 ENCOUNTER — Ambulatory Visit (INDEPENDENT_AMBULATORY_CARE_PROVIDER_SITE_OTHER): Payer: BC Managed Care – PPO | Admitting: Gastroenterology

## 2020-09-11 VITALS — BP 130/80 | HR 82 | Temp 97.9°F | Ht 61.0 in | Wt 129.0 lb

## 2020-09-11 DIAGNOSIS — R112 Nausea with vomiting, unspecified: Secondary | ICD-10-CM

## 2020-09-11 DIAGNOSIS — K920 Hematemesis: Secondary | ICD-10-CM

## 2020-09-11 DIAGNOSIS — R1013 Epigastric pain: Secondary | ICD-10-CM | POA: Diagnosis not present

## 2020-09-11 LAB — CBC WITH DIFFERENTIAL/PLATELET
Basophils Absolute: 0.1 10*3/uL (ref 0.0–0.1)
Basophils Relative: 0.8 % (ref 0.0–3.0)
Eosinophils Absolute: 0.2 10*3/uL (ref 0.0–0.7)
Eosinophils Relative: 1.3 % (ref 0.0–5.0)
HCT: 45.9 % (ref 36.0–46.0)
Hemoglobin: 15.7 g/dL — ABNORMAL HIGH (ref 12.0–15.0)
Lymphocytes Relative: 21.8 % (ref 12.0–46.0)
Lymphs Abs: 2.7 10*3/uL (ref 0.7–4.0)
MCHC: 34.3 g/dL (ref 30.0–36.0)
MCV: 98.1 fl (ref 78.0–100.0)
Monocytes Absolute: 1.2 10*3/uL — ABNORMAL HIGH (ref 0.1–1.0)
Monocytes Relative: 10.1 % (ref 3.0–12.0)
Neutro Abs: 8.2 10*3/uL — ABNORMAL HIGH (ref 1.4–7.7)
Neutrophils Relative %: 66 % (ref 43.0–77.0)
Platelets: 252 10*3/uL (ref 150.0–400.0)
RBC: 4.68 Mil/uL (ref 3.87–5.11)
RDW: 13.9 % (ref 11.5–15.5)
WBC: 12.4 10*3/uL — ABNORMAL HIGH (ref 4.0–10.5)

## 2020-09-11 MED ORDER — SUCRALFATE 1 G PO TABS: 1.0000 g | ORAL_TABLET | Freq: Three times a day (TID) | ORAL | 3 refills | Status: AC

## 2020-09-11 MED ORDER — ONDANSETRON HCL 4 MG PO TABS: 4.0000 mg | ORAL_TABLET | Freq: Three times a day (TID) | ORAL | 3 refills | Status: AC | PRN

## 2020-09-11 NOTE — Progress Notes (Signed)
09/11/2020 Nicole Scott [id number] 06/04/1960   HISTORY OF PRESENT ILLNESS: This is a 60 year old female who is a patient of Dr. Vena Rua.  She has only been seen by him once in November 2020 and then he performed EGD and colonoscopy also in November 2020.  Overall she has had multiple EGDs performed and has had extensive GI evaluation as listed below.  She has past medical history of GERD, hiatal hernia, H. pylori gastritis, diverticulosis, family history of gastric and colon cancer.  She had seen me back in April at which time she was complaining of nausea and vomiting and was concerned about recurrent H. pylori.  Her stool antigen was negative.  She was on omeprazole 20 mg twice daily and I asked her to increase to 40 mg twice daily after receiving results of the stool antigen, which she never did.  She is here again today with ongoing complaints of nausea and vomiting.  She tells me that over the weekend she was throwing up profusely.  She said that she threw up quite a bit of blood and was having black stools.  She also reports having a temperature up to 102.5 and then a low-grade fever for several days following that.  She said that she had a negative COVID test.  She is having epigastric abdominal pain.  Her last episode of vomiting was on Tuesday.  She says that her stools are still dark.  Colonoscopy November 2020 with Dr. Hilarie Fredrickson showed 5 polyps that were removed, the greatest was 5 mm in size.  She has had diverticulosis.   EGD November 2020 with Dr. Hilarie Fredrickson showed a normal esophagus, no cause for complaints of dysphagia.  She was found to have gastritis.   Pathology from EGD and colonoscopy 01/2019:   1. Surgical [P], gastric antrum and body - MILD CHRONIC GASTRITIS WITH INTESTINAL METAPLASIA - NO H. PYLORI IDENTIFIED - SEE COMMENT 2. Surgical [P], ileocecal valve x 1, transverse x 2, polyp (3) - TUBULAR ADENOMA (3 OF 6 FRAGMENTS) - BENIGN COLONIC MUCOSA (3 OF 6 FRAGMENTS) - NO HIGH  GRADE DYSPLASIA OR MALIGNANCY IDENTIFIED 3. Surgical [P], colon, sigmoid and descending, polyp (2) - TUBULAR ADENOMA (2 OF 2 FRAGMENTS) - NO HIGH GRADE DYSPLASIA OR MALIGNANCY IDENTIFIED     Colonoscopy dated 11/06/2018 --poor preparation.  Stool throughout the colon particularly in the right limited examination.  Though it is not mentioned there is evidence of diverticulosis in the sigmoid by pictures from this procedure.   EGD dated 08/14/2018 --hiatal hernia, antral erosions, erythema and erosions in the duodenum with duodenitis   Ultrasound abdomen complete 08/11/2018 --status post cholecystectomy with mild dilatation of the common bile duct likely due to reservoir effect.  Liver normal in size and echotexture.  Normal spleen   Gastric emptying scan 03/31/2017 --normal   Pathology results reviewed from prior procedures: January 2016 --gastric biopsies surface erosion and chronic inflammation of the antrum.  No H. pylori October 2018 -mild chronic gastritis no H. Pylori March 2019 --mild chronic gastropathy chronic gastritis.  No H. Pylori; mildly acanthotic stratified squamous mucosa without pathologic change distal esophagus February 2019 --nonspecific mild chronic inflammation of the sigmoid. June 2020 superficial erosion and chronic inflammation of gastric mucosa.  H. pylori negative.  Duodenal biopsy no pathologic change    Past Medical History:  Diagnosis Date   Anxiety    Depression    Diverticulosis    Endometriosis    Gastric ulcer 03/2014   Gastritis  Helicobacter pylori gastritis 2005   Hiatal hernia    Hyperlipidemia    Hypertension    Internal hemorrhoids    Migraine    Stroke [facility])    Past Surgical History:  Procedure Laterality Date   CHOLECYSTECTOMY     COLONOSCOPY     LAPAROSCOPIC HYSTERECTOMY     NEPHRECTOMY Left    UPPER GI ENDOSCOPY      reports that she has been smoking cigarettes. She has been smoking an average of 0.25 packs per day. She has never  used smokeless tobacco. She reports that she does not drink alcohol and does not use drugs. family history includes Brain cancer in her father and mother; Breast cancer in her maternal aunt; Colon cancer in her paternal grandmother; Colon polyps in her brother; Other in her son; Stomach cancer in her father. Allergies  Allergen Reactions   Doxycycline    Hydromorphone    Penicillins Swelling      Outpatient Encounter Medications as of 09/11/2020  Medication Sig   amLODipine (NORVASC) 10 MG tablet Take 10 mg by mouth daily.   Ascorbic Acid (VITAMIN C) 250 MG CHEW    aspirin EC 81 MG tablet Take 1 tablet (81 mg total) by mouth daily. Swallow whole.   busPIRone (BUSPAR) 10 MG tablet Take 10 mg by mouth daily.   calcium carbonate (OS-CAL) 1250 (500 Ca) MG chewable tablet Chew 1 tablet by mouth daily.   Evolocumab (REPATHA SURECLICK) 010 MG/ML SOAJ Inject 140 mg into the skin every 14 (fourteen) days.   famotidine (PEPCID) 20 MG tablet TAKE 1 TABLET(20 MG) BY MOUTH TWICE DAILY   omeprazole (PRILOSEC OTC) 20 MG tablet Take 20 mg by mouth daily.   sertraline (ZOLOFT) 100 MG tablet Take 100 mg by mouth daily.   vitamin B-12 (CYANOCOBALAMIN) 1000 MCG tablet Take 1,000 mcg by mouth daily.   [DISCONTINUED] lisinopril (ZESTRIL) 40 MG tablet Take 40 mg by mouth 2 (two) times daily.   [DISCONTINUED] cariprazine (VRAYLAR) capsule Take by mouth.   [DISCONTINUED] Lactobacillus (CULTURELLE DIGESTIVE WOMENS PO) Take by mouth.   [DISCONTINUED] Lactobacillus-Inulin (CULTURELLE DIGESTIVE DAILY PO) Take 1 capsule by mouth daily.   [DISCONTINUED] omeprazole (PRILOSEC) 40 MG capsule Take 1 capsule (40 mg total) by mouth in the morning and at bedtime.   [DISCONTINUED] ondansetron (ZOFRAN) 4 MG tablet Take 1 tablet (4 mg total) by mouth every 8 (eight) hours as needed for nausea or vomiting.   [DISCONTINUED] vitamin C (ASCORBIC ACID) 500 MG tablet Take 500 mg by mouth daily.   No facility-administered encounter  medications on file as of 09/11/2020.     REVIEW OF SYSTEMS  : All other systems reviewed and negative except where noted in the History of Present Illness.   PHYSICAL EXAM: BP 130/80   Pulse 82   Ht 5\' 1"  (1.549 m)   Wt 129 lb (58.5 kg)   BMI 24.37 kg/m  General: Well developed white female in no acute distress Head: Normocephalic and atraumatic Eyes:  sclerae anicteric,conjunctive pink. Ears: Normal auditory acuity Neck: Supple, no masses.  Lungs: Clear throughout to auscultation Heart: Regular rate and rhythm Abdomen: Soft, non-distended.  BS present.   Rectal:  No external abnormalities noted.  DRE revealed soft stool in the rectal vault that was very light brown to yellowish in color and heme negative.  No masses noted. Musculoskeletal: Symmetrical with no gross deformities  Skin: No lesions on visible extremities Extremities: No edema  Neurological: Alert oriented x 4, grossly  non-focal Psychological:  Alert and cooperative. Normal mood and affect  ASSESSMENT AND PLAN: *60 year old female with complaints of epigastric abdominal pain and nausea and vomiting.  Stating that over the weekend she started vomiting blood and having black stools.  Last episode of hematemesis she says was 2 days ago.  Says that her stools are still very dark, however they were very light brown to yellow and heme-negative on exam today.  I am going to check a CBC.  She is only on omeprazole 20 mg twice daily despite me asking her to increase it to 40 mg twice daily at the end of April after her H. pylori stool antigen study was negative.  She seems very preoccupied with her upper GI symptoms and her history of H. pylori.  And almost seems that she is determined to have an endoscopy every couple of years as she is very persistent with her complaints, but yet does not make the changes that we suggest.  Will plan for EGD with Dr. Loletha Carrow next week for reassessment (he had sooner availability).  In the interim I am  going to keep her on her omeprazole 20 mg twice daily, but will add Carafate tablet 4 times a day as well.  We will give Zofran for nausea.  Prescriptions sent to pharmacy.   CC:  Frances Maywood, FNP

## 2020-09-11 NOTE — Patient Instructions (Addendum)
Your provider has requested that you go to the basement level for lab work before leaving today. Press "B" on the elevator. The lab is located at the first door on the left as you exit the elevator.  We have sent the following medications to your pharmacy for you to pick up at your convenience: Carafate 1 gm tablet 30 minutes before meals and at bedtime. Zofran 4 mg every 8 hours as needed.  You have been scheduled for an endoscopy. Please follow written instructions given to you at your visit today. If you use inhalers (even only as needed), please bring them with you on the day of your procedure.  If you are age 53 or older, your body mass index should be between 23-30. Your Body mass index is 24.37 kg/m. If this is out of the aforementioned range listed, please consider follow up with your Primary Care Provider.  If you are age 48 or younger, your body mass index should be between 19-25. Your Body mass index is 24.37 kg/m. If this is out of the aformentioned range listed, please consider follow up with your Primary Care Provider.   __________________________________________________________  The [facility] providers would like to encourage you to use [facility] to communicate with providers for non-urgent requests or questions.  Due to long hold times on the telephone, sending your provider a message by [facility] may be a faster and more efficient way to get a response.  Please allow 48 business hours for a response.  Please remember that this is for non-urgent requests.

## 2020-09-15 NOTE — Progress Notes (Signed)
____________________________________________________________  Attending physician addendum:  Thank you for sending this case to me. I have reviewed the entire note and agree with the plan.  She also had a GES in [place] in 2019 with reportedly normal emptying but somewhat confusing wording in the report.  Wilfrid Lund, MD  ____________________________________________________________

## 2020-09-17 ENCOUNTER — Ambulatory Visit (AMBULATORY_SURGERY_CENTER): Payer: BC Managed Care – PPO | Admitting: Gastroenterology

## 2020-09-17 ENCOUNTER — Encounter: Payer: Self-pay | Admitting: Gastroenterology

## 2020-09-17 ENCOUNTER — Other Ambulatory Visit: Payer: Self-pay

## 2020-09-17 ENCOUNTER — Other Ambulatory Visit: Payer: Self-pay | Admitting: Gastroenterology

## 2020-09-17 VITALS — BP 127/64 | HR 73 | Temp 97.3°F | Resp 21 | Ht 61.0 in | Wt 129.0 lb

## 2020-09-17 DIAGNOSIS — K3189 Other diseases of stomach and duodenum: Secondary | ICD-10-CM

## 2020-09-17 DIAGNOSIS — K317 Polyp of stomach and duodenum: Secondary | ICD-10-CM | POA: Diagnosis not present

## 2020-09-17 DIAGNOSIS — R112 Nausea with vomiting, unspecified: Secondary | ICD-10-CM

## 2020-09-17 DIAGNOSIS — R1013 Epigastric pain: Secondary | ICD-10-CM | POA: Diagnosis not present

## 2020-09-17 MED ORDER — SODIUM CHLORIDE 0.9 % IV SOLN: 500.0000 mL | Freq: Once | INTRAVENOUS | Status: DC

## 2020-09-17 NOTE — Progress Notes (Signed)
Pt's states no medical or surgical changes since previsit or office visit. 

## 2020-09-17 NOTE — Progress Notes (Signed)
A and O x3. Report to RN. Tolerated MAC anesthesia well.Teeth unchanged after procedure. 

## 2020-09-17 NOTE — Progress Notes (Signed)
Called to room to assist during endoscopic procedure.  Patient ID and intended procedure confirmed with present staff. Received instructions for my participation in the procedure from the performing physician.  

## 2020-09-17 NOTE — Patient Instructions (Signed)
Please read handouts provided. Continue present medications. Await pathology results. Clinic follow-up with Dr. Hilarie Fredrickson.   YOU HAD AN ENDOSCOPIC PROCEDURE TODAY AT [facility]:   Refer to the procedure report that was given to you for any specific questions about what was found during the examination.  If the procedure report does not answer your questions, please call your gastroenterologist to clarify.  If you requested that your care partner not be given the details of your procedure findings, then the procedure report has been included in a sealed envelope for you to review at your convenience later.  YOU SHOULD EXPECT: Some feelings of bloating in the abdomen. Passage of more gas than usual.  Walking can help get rid of the air that was put into your GI tract during the procedure and reduce the bloating. If you had a lower endoscopy (such as a colonoscopy or flexible sigmoidoscopy) you may notice spotting of blood in your stool or on the toilet paper. If you underwent a bowel prep for your procedure, you may not have a normal bowel movement for a few days.  Please Note:  You might notice some irritation and congestion in your nose or some drainage.  This is from the oxygen used during your procedure.  There is no need for concern and it should clear up in a day or so.  SYMPTOMS TO REPORT IMMEDIATELY:    Following upper endoscopy (EGD)  Vomiting of blood or coffee ground material  New chest pain or pain under the shoulder blades  Painful or persistently difficult swallowing  New shortness of breath  Fever of 100F or higher  Black, tarry-looking stools  For urgent or emergent issues, a gastroenterologist can be reached at any hour by calling [phone]. Do not use MyChart messaging for urgent concerns.    DIET:  We do recommend a small meal at first, but then you may proceed to your regular diet.  Drink plenty of fluids but you should avoid alcoholic beverages for  24 hours.  ACTIVITY:  You should plan to take it easy for the rest of today and you should NOT DRIVE or use heavy machinery until tomorrow (because of the sedation medicines used during the test).    FOLLOW UP: Our staff will call the number listed on your records 48-72 hours following your procedure to check on you and address any questions or concerns that you may have regarding the information given to you following your procedure. If we do not reach you, we will leave a message.  We will attempt to reach you two times.  During this call, we will ask if you have developed any symptoms of COVID 19. If you develop any symptoms (ie: fever, flu-like symptoms, shortness of breath, cough etc.) before then, please call [phone].  If you test positive for Covid 19 in the 2 weeks post procedure, please call and report this information to [place].    If any biopsies were taken you will be contacted by phone or by letter within the next 1-3 weeks.  Please call us at [phone] if you have not heard about the biopsies in 3 weeks.    SIGNATURES/CONFIDENTIALITY: You and/or your care partner have signed paperwork which will be entered into your electronic medical record.  These signatures attest to the fact that that the information above on your After Visit Summary has been reviewed and is understood.  Full responsibility of the confidentiality of this discharge information lies with  you and/or your care-partner.

## 2020-09-17 NOTE — Op Note (Signed)
[facility] Patient Name: Nicole Scott Procedure Date: 09/17/2020 1:26 PM MRN: [id number] Endoscopist: Mallie Mussel L. Loletha Carrow , MD Age: 60 Referring MD:  Date of Birth: 1960/05/15 Gender: Female Account #: [id number] Procedure:                Upper GI endoscopy Indications:              Epigastric abdominal pain, Nausea with vomiting                            (chronic and episodic)                           H pylori negative                           Normal 2-hour GES 2019 Medicines:                Monitored Anesthesia Care Procedure:                Pre-Anesthesia Assessment:                           - Prior to the procedure, a History and Physical                            was performed, and patient medications and                            allergies were reviewed. The patient's tolerance of                            previous anesthesia was also reviewed. The risks                            and benefits of the procedure and the sedation                            options and risks were discussed with the patient.                            All questions were answered, and informed consent                            was obtained. Prior Anticoagulants: The patient has                            taken no previous anticoagulant or antiplatelet                            agents. ASA Grade Assessment: II - A patient with                            mild systemic disease. After reviewing the risks  and benefits, the patient was deemed in                            satisfactory condition to undergo the procedure.                           After obtaining informed consent, the endoscope was                            passed under direct vision. Throughout the                            procedure, the patient's blood pressure, pulse, and                            oxygen saturations were monitored continuously. The                            GIF [id number] #[id number]  was introduced through the                            mouth, and advanced to the second part of duodenum.                            The upper GI endoscopy was accomplished without                            difficulty. The patient tolerated the procedure                            well. Scope In: Scope Out: Findings:                 The esophagus was normal.                           A single 5 mm mucosal papule (nodule) was found in                            the prepyloric region of the stomach. Biopsies were                            taken with a cold forceps for histology.                           The cardia and gastric fundus were normal on                            retroflexion. (Hill grade 2)                           The examined duodenum was normal. Complications:            No immediate complications. Estimated Blood Loss:     Estimated blood loss was minimal. Impression:               -  Normal esophagus.                           - A single mucosal papule (nodule) found in the                            stomach. Biopsied.                           - Normal examined duodenum.                           No cause for this patient's cyclic vomiting seen on                            this exam. Recommendation:           - Patient has a contact number available for                            emergencies. The signs and symptoms of potential                            delayed complications were discussed with the                            patient. Return to normal activities tomorrow.                            Written discharge instructions were provided to the                            patient.                           - Resume previous diet.                           - Continue present medications.                           - Clinic follow-up and any further testing to be                            arranged with Dr. Hilarie Fredrickson.                           (Consider 4-hour  GES) [name] L. Loletha Carrow, MD 09/17/2020 1:41:06 PM This report has been signed electronically.

## 2020-09-19 ENCOUNTER — Telehealth: Payer: Self-pay | Admitting: *Deleted

## 2020-09-19 NOTE — Telephone Encounter (Signed)
  Follow up Call-  Call back number 09/17/2020 02/05/2019  Post procedure Call Back phone  # [phone]  Permission to leave phone message Yes Yes  Some recent data might be hidden     Patient questions:  Do you have a fever, pain , or abdominal swelling? No. Pain Score  0 *  Have you tolerated food without any problems? Yes.    Have you been able to return to your normal activities? Yes.    Do you have any questions about your discharge instructions: Diet   No. Medications  No. Follow up visit  No.  Do you have questions or concerns about your Care? No.  Actions: * If pain score is 4 or above: No action needed, pain <4.Have you developed a fever since your procedure? no  2.   Have you had an respiratory symptoms (SOB or cough) since your procedure? no  3.   Have you tested positive for COVID 19 since your procedure no  4.   Have you had any family members/close contacts diagnosed with the COVID 19 since your procedure?  no   If yes to any of these questions please route to Joylene John, RN and Joella Prince, RN

## 2022-01-06 ENCOUNTER — Encounter: Payer: Self-pay | Admitting: Internal Medicine

## 2022-02-05 ENCOUNTER — Ambulatory Visit (AMBULATORY_SURGERY_CENTER): Payer: BC Managed Care – PPO | Admitting: *Deleted

## 2022-02-05 VITALS — Ht 61.0 in | Wt 132.8 lb

## 2022-02-05 DIAGNOSIS — Z800 Family history of malignant neoplasm of digestive organs: Secondary | ICD-10-CM

## 2022-02-05 DIAGNOSIS — Z86010 Personal history of colonic polyps: Secondary | ICD-10-CM

## 2022-02-05 MED ORDER — NA SULFATE-K SULFATE-MG SULF 17.5-3.13-1.6 GM/177ML PO SOLN: 1.0000 | Freq: Once | ORAL | 0 refills | Status: AC

## 2022-02-05 NOTE — Progress Notes (Signed)
No egg or soy allergy known to patient  No issues known to pt with past sedation with any surgeries or procedures Patient denies ever being told they had issues or difficulty with intubation  No FH of Malignant Hyperthermia Pt is not on diet pills Pt is not on  home 02  Pt is not on blood thinners  Pt denies issues with constipation  2 day prep Pt encouraged to use to use Singlecare or Goodrx to reduce cost  In pat Patient's chart reviewed by Osvaldo Angst CNRA prior to previsit and patient appropriate for the [facility].  Previsit completed and red dot placed by patient's name on their procedure day (on provider's schedule).  Marland Kitchen

## 2022-02-15 ENCOUNTER — Encounter: Payer: Self-pay | Admitting: Internal Medicine

## 2022-02-19 ENCOUNTER — Encounter: Payer: Self-pay | Admitting: Internal Medicine

## 2022-02-19 ENCOUNTER — Ambulatory Visit (AMBULATORY_SURGERY_CENTER): Payer: BC Managed Care – PPO | Admitting: Internal Medicine

## 2022-02-19 VITALS — BP 118/74 | HR 88 | Temp 98.3°F | Resp 20 | Ht 61.0 in | Wt 132.0 lb

## 2022-02-19 DIAGNOSIS — Z860100 Personal history of colon polyps, unspecified: Secondary | ICD-10-CM

## 2022-02-19 DIAGNOSIS — D123 Benign neoplasm of transverse colon: Secondary | ICD-10-CM

## 2022-02-19 DIAGNOSIS — Z09 Encounter for follow-up examination after completed treatment for conditions other than malignant neoplasm: Secondary | ICD-10-CM | POA: Diagnosis not present

## 2022-02-19 DIAGNOSIS — Z800 Family history of malignant neoplasm of digestive organs: Secondary | ICD-10-CM | POA: Diagnosis not present

## 2022-02-19 DIAGNOSIS — D124 Benign neoplasm of descending colon: Secondary | ICD-10-CM | POA: Diagnosis not present

## 2022-02-19 MED ORDER — SODIUM CHLORIDE 0.9 % IV SOLN: 500.0000 mL | INTRAVENOUS | Status: DC

## 2022-02-19 NOTE — Patient Instructions (Signed)
   Handouts on polyps & diverticulosis given to you today  Await pathology results on polyps removed    YOU HAD AN ENDOSCOPIC PROCEDURE TODAY AT [facility]:   Refer to the procedure report that was given to you for any specific questions about what was found during the examination.  If the procedure report does not answer your questions, please call your gastroenterologist to clarify.  If you requested that your care partner not be given the details of your procedure findings, then the procedure report has been included in a sealed envelope for you to review at your convenience later.  YOU SHOULD EXPECT: Some feelings of bloating in the abdomen. Passage of more gas than usual.  Walking can help get rid of the air that was put into your GI tract during the procedure and reduce the bloating. If you had a lower endoscopy (such as a colonoscopy or flexible sigmoidoscopy) you may notice spotting of blood in your stool or on the toilet paper. If you underwent a bowel prep for your procedure, you may not have a normal bowel movement for a few days.  Please Note:  You might notice some irritation and congestion in your nose or some drainage.  This is from the oxygen used during your procedure.  There is no need for concern and it should clear up in a day or so.  SYMPTOMS TO REPORT IMMEDIATELY:  Following lower endoscopy (colonoscopy or flexible sigmoidoscopy):  Excessive amounts of blood in the stool  Significant tenderness or worsening of abdominal pains  Swelling of the abdomen that is new, acute  Fever of 100F or higher  For urgent or emergent issues, a gastroenterologist can be reached at any hour by calling [phone]. Do not use MyChart messaging for urgent concerns.    DIET:  We do recommend a small meal at first, but then you may proceed to your regular diet.  Drink plenty of fluids but you should avoid alcoholic beverages for 24 hours.  ACTIVITY:  You should plan to  take it easy for the rest of today and you should NOT DRIVE or use heavy machinery until tomorrow (because of the sedation medicines used during the test).    FOLLOW UP: Our staff will call the number listed on your records the next business day following your procedure.  We will call around 7:15- 8:00 am to check on you and address any questions or concerns that you may have regarding the information given to you following your procedure. If we do not reach you, we will leave a message.     If any biopsies were taken you will be contacted by phone or by letter within the next 1-3 weeks.  Please call us at [phone] if you have not heard about the biopsies in 3 weeks.    SIGNATURES/CONFIDENTIALITY: You and/or your care partner have signed paperwork which will be entered into your electronic medical record.  These signatures attest to the fact that that the information above on your After Visit Summary has been reviewed and is understood.  Full responsibility of the confidentiality of this discharge information lies with you and/or your care-partner.

## 2022-02-19 NOTE — Progress Notes (Unsigned)
Called to room to assist during endoscopic procedure.  Patient ID and intended procedure confirmed with present staff. Received instructions for my participation in the procedure from the performing physician.  

## 2022-02-19 NOTE — Progress Notes (Unsigned)
GASTROENTEROLOGY PROCEDURE H&P NOTE   Primary Care Physician: Frances Maywood, FNP    Reason for Procedure:  History of colon polyps  Plan:    Colonoscopy  Patient is appropriate for endoscopic procedure(s) in the ambulatory ([place]) setting.  The nature of the procedure, as well as the risks, benefits, and alternatives were carefully and thoroughly reviewed with the patient. Ample time for discussion and questions allowed. The patient understood, was satisfied, and agreed to proceed.     HPI: Nicole Scott is a 61 y.o. female who presents for colonoscopy.  Medical history as below.  Tolerated the prep.  No recent chest pain or shortness of breath.  No abdominal pain today.  Past Medical History:  Diagnosis Date   Allergy    Anxiety    Depression    Diverticulosis    Endometriosis    Gastric ulcer 03/2014   Gastritis    Helicobacter pylori gastritis 2005   Hyperlipidemia    Hypertension    Internal hemorrhoids    Migraine    Stroke [facility])     Past Surgical History:  Procedure Laterality Date   CHOLECYSTECTOMY     COLONOSCOPY     LAPAROSCOPIC HYSTERECTOMY     NEPHRECTOMY Left    UPPER GI ENDOSCOPY      Prior to Admission medications   Medication Sig Start Date End Date Taking? Authorizing Provider  amLODipine (NORVASC) 10 MG tablet Take 10 mg by mouth daily. 07/24/19  Yes [provider]  Ascorbic Acid (VITAMIN C) 250 MG CHEW  03/23/18  Yes [provider]  busPIRone (BUSPAR) 10 MG tablet Take 10 mg by mouth daily. 03/23/20  Yes [provider]  calcium carbonate (OS-CAL) 1250 (500 Ca) MG chewable tablet Chew 1 tablet by mouth daily.   Yes [provider]  lisinopril (ZESTRIL) 40 MG tablet Take 40 mg by mouth 2 (two) times daily. 12/29/21  Yes [provider]  sertraline (ZOLOFT) 100 MG tablet Take 100 mg by mouth daily.   Yes [provider]  vitamin B-12 (CYANOCOBALAMIN) 1000 MCG tablet Take 1,000 mcg by mouth  daily.   Yes [provider]  VRAYLAR 1.5 MG capsule Take 1.5 mg by mouth daily. 01/19/22  Yes [provider]  aspirin EC 81 MG tablet Take 1 tablet (81 mg total) by mouth daily. Swallow whole. Patient not taking: Reported on 02/05/2022 08/20/19   Garvin Fila, MD  diphenhydrAMINE (BENADRYL ALLERGY) 25 MG tablet 2 tabs orally qhs 06/15/18   [provider]  Evolocumab (REPATHA SURECLICK) 010 MG/ML SOAJ Inject 140 mg into the skin every 14 (fourteen) days. Patient not taking: Reported on 02/05/2022 10/30/19   Frann Rider, NP  famotidine (PEPCID) 20 MG tablet TAKE 1 TABLET(20 MG) BY MOUTH TWICE DAILY 07/18/20   Zehr, Laban Emperor, PA-C  omeprazole (PRILOSEC OTC) 20 MG tablet Take 20 mg by mouth daily. Patient not taking: Reported on 02/05/2022    [provider]  ondansetron (ZOFRAN) 4 MG tablet Take 1 tablet (4 mg total) by mouth every 8 (eight) hours as needed for nausea or vomiting. Patient not taking: Reported on 02/05/2022 09/11/20   Zehr, Janett Billow D, PA-C  sucralfate (CARAFATE) 1 g tablet Take 1 tablet (1 g total) by mouth 4 (four) times daily -  with meals and at bedtime. Patient not taking: Reported on 02/05/2022 09/11/20   Loralie Champagne, PA-C    Current Outpatient Medications  Medication Sig Dispense Refill   amLODipine (  NORVASC) 10 MG tablet Take 10 mg by mouth daily.     Ascorbic Acid (VITAMIN C) 250 MG CHEW      busPIRone (BUSPAR) 10 MG tablet Take 10 mg by mouth daily.     calcium carbonate (OS-CAL) 1250 (500 Ca) MG chewable tablet Chew 1 tablet by mouth daily.     lisinopril (ZESTRIL) 40 MG tablet Take 40 mg by mouth 2 (two) times daily.     sertraline (ZOLOFT) 100 MG tablet Take 100 mg by mouth daily.     vitamin B-12 (CYANOCOBALAMIN) 1000 MCG tablet Take 1,000 mcg by mouth daily.     VRAYLAR 1.5 MG capsule Take 1.5 mg by mouth daily.     aspirin EC 81 MG tablet Take 1 tablet (81 mg total) by mouth daily. Swallow whole. (Patient not taking: Reported  on 02/05/2022) 30 tablet 11   diphenhydrAMINE (BENADRYL ALLERGY) 25 MG tablet 2 tabs orally qhs     Evolocumab (REPATHA SURECLICK) 324 MG/ML SOAJ Inject 140 mg into the skin every 14 (fourteen) days. (Patient not taking: Reported on 02/05/2022) 2 mL 4   famotidine (PEPCID) 20 MG tablet TAKE 1 TABLET(20 MG) BY MOUTH TWICE DAILY 180 tablet 3   omeprazole (PRILOSEC OTC) 20 MG tablet Take 20 mg by mouth daily. (Patient not taking: Reported on 02/05/2022)     ondansetron (ZOFRAN) 4 MG tablet Take 1 tablet (4 mg total) by mouth every 8 (eight) hours as needed for nausea or vomiting. (Patient not taking: Reported on 02/05/2022) 30 tablet 3   sucralfate (CARAFATE) 1 g tablet Take 1 tablet (1 g total) by mouth 4 (four) times daily -  with meals and at bedtime. (Patient not taking: Reported on 02/05/2022) 120 tablet 3   Current Facility-Administered Medications  Medication Dose Route Frequency Provider Last Rate Last Admin   0.9 %  sodium chloride infusion  500 mL Intravenous Continuous Kory Rains, Lajuan Lines, MD        Allergies as of 02/19/2022 - Review Complete 02/19/2022  Allergen Reaction Noted   Doxycycline  08/20/2019   Hydromorphone  08/20/2019   Levofloxacin Other (See Comments) 02/05/2022   Penicillins Swelling 12/23/2016    Family History  Problem Relation Age of Onset   Brain cancer Mother    Stomach cancer Father    Brain cancer Father    Colon polyps Brother    Colon cancer Brother    Breast cancer Maternal Aunt    Colon cancer Maternal Grandmother    Colon cancer Paternal Grandmother    Other Son    Esophageal cancer Neg Hx    Rectal cancer Neg Hx     Social History   Socioeconomic History   Marital status: Married    Spouse name: Not on file   Number of children: 2   Years of education: Not on file   Highest education level: Not on file  Occupational History   Occupation: retired  Tobacco Use   Smoking status: Every Day    Packs/day: 0.25    Types: Cigarettes   Smokeless  tobacco: Never   Tobacco comments:    Smokes only a few daily  Vaping Use   Vaping Use: Never used  Substance and Sexual Activity   Alcohol use: No   Drug use: No   Sexual activity: Not on file  Other Topics Concern   Not on file  Social History Narrative   Not on file   Social Determinants of Radio broadcast assistant  Strain: Not on file  Food Insecurity: Not on file  Transportation Needs: Not on file  Physical Activity: Not on file  Stress: Not on file  Social Connections: Not on file  Intimate Partner Violence: Not on file    Physical Exam: Vital signs in last 24 hours: '@BP'$  (!) 161/87   Pulse (!) 103   Temp 98.3 F (36.8 C)   Ht '5\' 1"'$  (1.549 m)   Wt 132 lb (59.9 kg)   SpO2 97%   BMI 24.94 kg/m  GEN: NAD EYE: Sclerae anicteric ENT: MMM CV: Non-tachycardic Pulm: CTA b/l GI: Soft, NT/ND NEURO:  Alert & Oriented x 3   Zenovia Jarred, MD [facility]  02/19/2022 2:55 PM

## 2022-02-19 NOTE — Op Note (Signed)
[facility] Patient Name: Nicole Scott Procedure Date: 02/19/2022 2:43 PM MRN: [id number] Endoscopist: Jerene Bears , MD, [id number] Age: 61 Referring MD:  Date of Birth: 26-Jul-1960 Gender: Female Account #: [id number] Procedure:                Colonoscopy Indications:              High risk colon cancer surveillance: Personal                            history of multiple adenomas, Family history of                            colon cancer in a first-degree relative (brother),                            Last colonoscopy: November 2020 Medicines:                Monitored Anesthesia Care Procedure:                Pre-Anesthesia Assessment:                           - Prior to the procedure, a History and Physical                            was performed, and patient medications and                            allergies were reviewed. The patient's tolerance of                            previous anesthesia was also reviewed. The risks                            and benefits of the procedure and the sedation                            options and risks were discussed with the patient.                            All questions were answered, and informed consent                            was obtained. Prior Anticoagulants: The patient has                            taken no anticoagulant or antiplatelet agents. ASA                            Grade Assessment: II - A patient with mild systemic                            disease. After reviewing the risks and benefits,  the patient was deemed in satisfactory condition to                            undergo the procedure.                           After obtaining informed consent, the colonoscope                            was passed under direct vision. Throughout the                            procedure, the patient's blood pressure, pulse, and                            oxygen saturations were monitored  continuously. The                            [id number] [id number]-[id number] Slim SB Colonoscope was introduced                            through the anus and advanced to the cecum,                            identified by appendiceal orifice and ileocecal                            valve. The colonoscopy was performed without                            difficulty. The patient tolerated the procedure                            well. The quality of the bowel preparation was good                            (2 day prep). The ileocecal valve, appendiceal                            orifice, and rectum were photographed. Scope In: 3:05:41 PM Scope Out: 3:26:28 PM Scope Withdrawal Time: 0 hours 17 minutes 35 seconds  Total Procedure Duration: 0 hours 20 minutes 47 seconds  Findings:                 The digital rectal exam was normal.                           Two sessile polyps were found in the transverse                            colon. The polyps were 4 to 6 mm in size. These                            polyps were removed with a cold snare. Resection  and retrieval were complete.                           A 6 mm polyp was found in the descending colon. The                            polyp was sessile. The polyp was removed with a                            cold snare. Resection and retrieval were complete.                           Multiple large-mouthed and small-mouthed                            diverticula were found in the sigmoid colon and                            descending colon.                           The retroflexed view of the distal rectum and anal                            verge was normal and showed no anal or rectal                            abnormalities. Complications:            No immediate complications. Estimated Blood Loss:     Estimated blood loss was minimal. Impression:               - Two 4 to 6 mm polyps in the transverse colon,                             removed with a cold snare. Resected and retrieved.                           - One 6 mm polyp in the descending colon, removed                            with a cold snare. Resected and retrieved.                           - Severe diverticulosis in the sigmoid colon and in                            the descending colon.                           - The distal rectum and anal verge are normal on                            retroflexion view. Recommendation:           -  Patient has a contact number available for                            emergencies. The signs and symptoms of potential                            delayed complications were discussed with the                            patient. Return to normal activities tomorrow.                            Written discharge instructions were provided to the                            patient.                           - Resume previous diet.                           - Continue present medications.                           - Await pathology results.                           - Repeat colonoscopy is recommended for                            surveillance. The colonoscopy date will be                            determined after pathology results from today's                            exam become available for review. Jerene Bears, MD 02/19/2022 3:28:53 PM This report has been signed electronically.

## 2022-02-19 NOTE — Progress Notes (Unsigned)
Sedate, gd SR, tolerated procedure well, VSS, report to RN 

## 2022-02-22 ENCOUNTER — Telehealth: Payer: Self-pay | Admitting: *Deleted

## 2022-02-22 NOTE — Telephone Encounter (Signed)
  Follow up Call-     02/19/2022    2:16 PM 09/17/2020   12:48 PM  Call back number  Post procedure Call Back phone  # [phone]  Permission to leave phone message Yes Yes     Patient questions:  Do you have a fever, pain , or abdominal swelling? No. Pain Score  0 *  Have you tolerated food without any problems? Yes.    Have you been able to return to your normal activities? Yes.    Do you have any questions about your discharge instructions: Diet   No. Medications  No. Follow up visit  No.  Do you have questions or concerns about your Care? No.  Actions: * If pain score is 4 or above: No action needed, pain <4.

## 2022-02-24 ENCOUNTER — Encounter: Payer: Self-pay | Admitting: Internal Medicine

## 2022-04-22 ENCOUNTER — Encounter: Payer: Self-pay | Admitting: Plastic Surgery

## 2022-04-22 ENCOUNTER — Telehealth: Payer: Self-pay | Admitting: Plastic Surgery

## 2022-04-22 ENCOUNTER — Ambulatory Visit (INDEPENDENT_AMBULATORY_CARE_PROVIDER_SITE_OTHER): Payer: BC Managed Care – PPO | Admitting: Plastic Surgery

## 2022-04-22 VITALS — BP 125/70 | HR 95 | Ht 61.0 in | Wt 133.4 lb

## 2022-04-22 DIAGNOSIS — T8543XA Leakage of breast prosthesis and implant, initial encounter: Secondary | ICD-10-CM

## 2022-04-22 NOTE — Progress Notes (Signed)
Referring Provider Frances Maywood, [place] [address] [place] [address]   CC:  Chief Complaint  Patient presents with   Advice Only      Nicole Scott is an 62 y.o. female.  HPI: Nicole Scott is a 62 year old female who presents today for evaluation of a right ruptured breast implant.  Patient states that she had her implants placed at [facility] 40 years ago and has not had them changed since.  She underwent mammography this year on January 26 and was noted to have a right implant rupture.  The mammogram was read as BI-RADS 2 with fibroglandular changes and evidence of a small amount of extracapsular silicone.  The patient became concerned when she noted clear drainage from her right nipple and requested an expedited appointment today.  She states she is otherwise healthy and remains active she is not on any type of blood thinner.  Allergies  Allergen Reactions   Doxycycline    Hydromorphone    Levofloxacin Other (See Comments)   Penicillins Swelling    Outpatient Encounter Medications as of 04/22/2022  Medication Sig   amLODipine (NORVASC) 10 MG tablet Take 10 mg by mouth daily.   Ascorbic Acid (VITAMIN C) 250 MG CHEW    atorvastatin (LIPITOR) 20 MG tablet Take 20 mg by mouth daily.   busPIRone (BUSPAR) 10 MG tablet Take 10 mg by mouth daily.   calcium carbonate (OS-CAL) 1250 (500 Ca) MG chewable tablet Chew 1 tablet by mouth daily.   lisinopril (ZESTRIL) 40 MG tablet Take 40 mg by mouth 2 (two) times daily.   sertraline (ZOLOFT) 100 MG tablet Take 100 mg by mouth daily.   vitamin B-12 (CYANOCOBALAMIN) 1000 MCG tablet Take 1,000 mcg by mouth daily.   VRAYLAR 1.5 MG capsule Take 1.5 mg by mouth daily.   aspirin EC 81 MG tablet Take 1 tablet (81 mg total) by mouth daily. Swallow whole. (Patient not taking: Reported on 02/05/2022)   diphenhydrAMINE (BENADRYL ALLERGY) 25 MG tablet 2 tabs orally qhs (Patient not taking: Reported on 04/22/2022)   Evolocumab (REPATHA SURECLICK)  [id number] MG/ML SOAJ Inject 140 mg into the skin every 14 (fourteen) days. (Patient not taking: Reported on 02/05/2022)   famotidine (PEPCID) 20 MG tablet TAKE 1 TABLET(20 MG) BY MOUTH TWICE DAILY (Patient not taking: Reported on 04/22/2022)   omeprazole (PRILOSEC OTC) 20 MG tablet Take 20 mg by mouth daily. (Patient not taking: Reported on 02/05/2022)   ondansetron (ZOFRAN) 4 MG tablet Take 1 tablet (4 mg total) by mouth every 8 (eight) hours as needed for nausea or vomiting. (Patient not taking: Reported on 02/05/2022)   sucralfate (CARAFATE) 1 g tablet Take 1 tablet (1 g total) by mouth 4 (four) times daily -  with meals and at bedtime. (Patient not taking: Reported on 02/05/2022)   No facility-administered encounter medications on file as of 04/22/2022.     Past Medical History:  Diagnosis Date   Allergy    Anxiety    Depression    Diverticulosis    Endometriosis    Gastric ulcer 03/2014   Gastritis    Helicobacter pylori gastritis 2005   Hyperlipidemia    Hypertension    Internal hemorrhoids    Migraine    Stroke [facility])     Past Surgical History:  Procedure Laterality Date   CHOLECYSTECTOMY     COLONOSCOPY     LAPAROSCOPIC HYSTERECTOMY     NEPHRECTOMY Left    UPPER GI ENDOSCOPY  Family History  Problem Relation Age of Onset   Brain cancer Mother    Stomach cancer Father    Brain cancer Father    Colon polyps Brother    Colon cancer Brother    Breast cancer Maternal Aunt    Colon cancer Maternal Grandmother    Colon cancer Paternal Grandmother    Other Son    Esophageal cancer Neg Hx    Rectal cancer Neg Hx     Social History   Social History Narrative   Not on file     Review of Systems General: Denies fevers, chills, weight loss CV: Denies chest pain, shortness of breath, palpitations Breast: Silicone implants placed 40 years ago, BI-RADS 2 mammogram, evidence of implant rupture on the right.  Physical Exam    04/22/2022   11:16 AM 02/19/2022    3:54 PM  02/19/2022    3:40 PM  Vitals with BMI  Height 5' 1"$     Weight 133 lbs 6 oz    BMI 0000000    Systolic 0000000 [id number]  Diastolic 70 74 62  Pulse 95 88 85    General:  No acute distress,  Alert and oriented, Non-Toxic, Normal speech and affect Breast: On breast exam the breasts appear normal.  There is no nipple discharge at the time of the appointment.  The breasts are normal in appearance.  I do not palpate any dominant masses.  The implants are palpable under the skin and there is a grade 2 ptosis with the implant sitting slightly higher than the nipple areolar complex.  The implants measure approximately 12 cm x 12 cm Mammogram: BI-RADS 2 is noted with evidence of implant rupture on the right Assessment/Plan Ruptured silicone implant: Patient has a ruptured implant which she would like to have removed.  She would also like to have new implants placed.  She does not have any of the original documentation from the implants.  I told her that this will make choosing implants slightly more difficult but I believe I can find an implant that we will work nicely.  She may at some point desire a mastopexy however I have asked that she defer this until after the ruptured implant has been removed and the new implants placed and given time to settle before she decides on a mastopexy.  The quality of her skin is less than ideal due to sun damage.  I believe this will change both the level that the implant settles and the need for a mastopexy.  She understands and agrees.  We did discuss the risks of the procedure including bleeding infection seroma formation.  She also understands that I will be making a professional judgment on the placement of the implants since I do not have the previous implant information.  She is happy with this.  Will provide her with a quote and schedule if she is interested in proceeding.  [name] 04/22/2022, 1:00 PM

## 2022-04-22 NOTE — Telephone Encounter (Signed)
Mammogram and office notes received from [facility] in [place]. Given to Dr. Lovena Le to review and copy sent to batch scan

## 2022-04-22 NOTE — Telephone Encounter (Signed)
Pt called about her implant is leaking out her nipple and pt has consult on 3.13.24 and is calling to see if she can come in sooner today to be seen if possible

## 2022-05-03 ENCOUNTER — Telehealth: Payer: Self-pay | Admitting: Plastic Surgery

## 2022-05-03 NOTE — Telephone Encounter (Signed)
Wanted to speak with scheduler to see if insurance is approved yet.

## 2022-05-04 ENCOUNTER — Telehealth: Payer: Self-pay | Admitting: *Deleted

## 2022-05-04 NOTE — Telephone Encounter (Signed)
Quote for bilateral removal and replacement of silicone implants with capsulectomy AND quote for Bilateral replacement of silicone implants mailed to patient. Call placed to patient and quotes reviewed. Advised Nicole Scott someone from our office would contact her when we have a response from her insurance company. She verbalized understanding

## 2022-05-04 NOTE — Telephone Encounter (Signed)
Called patient and advised we are 2-3 weeks behind submitting insurance for [place]. We will call her once we submit.

## 2022-05-11 ENCOUNTER — Telehealth: Payer: Self-pay | Admitting: *Deleted

## 2022-05-11 NOTE — Telephone Encounter (Signed)
Call received from pt and pt's husband to discuss quotes mailed to pt which they had received. Reviewed quotes and explained who would collect each fee. Advised him her case had not yet been submitted to insurance but someone from our office would reach out when we had a response from her ins company. He verbalized understanding

## 2022-05-17 NOTE — Telephone Encounter (Signed)
Pt called to let surgical schedulers know she is willing to pay out of pocket to have both procedures at once. Pt wants to talk with someone to make sure understands quote so she can move forward with surgery.

## 2022-05-18 ENCOUNTER — Telehealth: Payer: Self-pay | Admitting: *Deleted

## 2022-05-18 NOTE — Telephone Encounter (Signed)
No case submitted to [facility] for removal of implants.  [facility] clearly states on page 9 of the document:  When Surgical Management of Breast Implants is not covered:  1.Insertion of the implant is not covered for primarily cosmetic enhancement of the breast in the absence of mastectomy for breast cancer, medically necessary risk-reducing mastectomy or other breast disease.  2.Removal of the implant is not covered for asymptomatic patients desiring removal.  3.Removal of the implant is not covered when the original implant surgery was primarily for cosmetic reasons or other non-covered indications.  4.Removal of breast implants with capsulectomy/capsulotomy is not covered for [employer] Class III contractures in patients with implants for cosmetic purposes.

## 2022-05-18 NOTE — Telephone Encounter (Signed)
LVM to schedule surgery (holding 4/8 @ [facility] currently for patient - 1p sx time)

## 2022-05-19 ENCOUNTER — Institutional Professional Consult (permissible substitution): Payer: BC Managed Care – PPO | Admitting: Plastic Surgery

## 2022-05-20 ENCOUNTER — Ambulatory Visit (INDEPENDENT_AMBULATORY_CARE_PROVIDER_SITE_OTHER): Payer: BC Managed Care – PPO | Admitting: Physician Assistant

## 2022-05-20 DIAGNOSIS — T8543XA Leakage of breast prosthesis and implant, initial encounter: Secondary | ICD-10-CM

## 2022-05-20 MED ORDER — OXYCODONE HCL 5 MG PO TABS: 5.0000 mg | ORAL_TABLET | Freq: Four times a day (QID) | ORAL | 0 refills | Status: AC | PRN

## 2022-05-20 MED ORDER — ONDANSETRON 4 MG PO TBDP: 4.0000 mg | ORAL_TABLET | Freq: Three times a day (TID) | ORAL | 0 refills | Status: AC | PRN

## 2022-05-20 NOTE — Progress Notes (Signed)
Patient ID: Nicole Scott, female    DOB: 1960-06-28, 62 y.o.   MRN: [id number]  Chief Complaint  Patient presents with   Pre-op Exam      ICD-10-CM   1. Breast implant leak, initial encounter  T85.43XA        History of Present Illness: Nicole Scott is a 62 y.o.  female  with a history of breast augmentation.  She presents for preoperative evaluation for upcoming procedure, bilateral implant removal and replacement, scheduled for 06/14/2022 with Dr.  Lovena Le .  The patient has not had problems with anesthesia.  She states that she has had no issues with previous surgeries.  Denies any personal or family history of blood clots or clotting disorder.  No personal history of cancer, severe cardiac, or severe pulmonary disease.  She does state that she has smoked for 33 years, but is now down to only 4 cigarettes/day and states that she had recently quit for an entire month.  Emphasized the importance of immediate discontinuation of nicotine use and explained in depth the risks of poor wound healing and infection.  She confirms that she is a C cup with current implants and would like to be the same size (or slightly larger) after her implant exchange.  Patient understands that there will likely be a new inframammary incision.  She endorses severe allergy to penicillin.  Summary of Previous Visit: She was seen for consult by Dr. Lovena Le on 04/22/2022.  At that time, reports that she had implant placement 40 years ago and has not since had an exchange.  Mammogram obtained earlier this year revealed right implant rupture, otherwise BI-RADS Category 2.  She then became concerned when she noted clear drainage from right nipple and requested expedited appointment.  She inquired about mastopexy, but she was informed that this will be performed after the implant exchange rather than at the same time.  Sun damage noted to skin.  Discussed risks and benefits.  She expressed understanding and was agreeable to the  plan.  Job: Retired.  PMH Significant for: Previous breast augmentation, right-sided implant rupture, history of solitary kidney, HTN, HLD, GAD.   Past Medical History: Allergies: Allergies  Allergen Reactions   Doxycycline    Hydromorphone    Levofloxacin Other (See Comments)   Penicillins Swelling    Current Medications:  Current Outpatient Medications:    amLODipine (NORVASC) 10 MG tablet, Take 10 mg by mouth daily., Disp: , Rfl:    Ascorbic Acid (VITAMIN C) 250 MG CHEW, , Disp: , Rfl:    atorvastatin (LIPITOR) 20 MG tablet, Take 20 mg by mouth daily., Disp: , Rfl:    busPIRone (BUSPAR) 10 MG tablet, Take 10 mg by mouth daily., Disp: , Rfl:    calcium carbonate (OS-CAL) 1250 (500 Ca) MG chewable tablet, Chew 1 tablet by mouth daily., Disp: , Rfl:    diphenhydrAMINE (BENADRYL ALLERGY) 25 MG tablet, , Disp: , Rfl:    Evolocumab (REPATHA SURECLICK) [id number] MG/ML SOAJ, Inject 140 mg into the skin every 14 (fourteen) days., Disp: 2 mL, Rfl: 4   famotidine (PEPCID) 20 MG tablet, TAKE 1 TABLET(20 MG) BY MOUTH TWICE DAILY, Disp: 180 tablet, Rfl: 3   lisinopril (ZESTRIL) 40 MG tablet, Take 40 mg by mouth 2 (two) times daily., Disp: , Rfl:    omeprazole (PRILOSEC OTC) 20 MG tablet, Take 20 mg by mouth daily., Disp: , Rfl:    ondansetron (ZOFRAN) 4 MG tablet, Take 1 tablet (4 mg  total) by mouth every 8 (eight) hours as needed for nausea or vomiting., Disp: 30 tablet, Rfl: 3   sertraline (ZOLOFT) 100 MG tablet, Take 100 mg by mouth daily., Disp: , Rfl:    sucralfate (CARAFATE) 1 g tablet, Take 1 tablet (1 g total) by mouth 4 (four) times daily -  with meals and at bedtime., Disp: 120 tablet, Rfl: 3   vitamin B-12 (CYANOCOBALAMIN) 1000 MCG tablet, Take 1,000 mcg by mouth daily., Disp: , Rfl:    VRAYLAR 1.5 MG capsule, Take 1.5 mg by mouth daily., Disp: , Rfl:    aspirin EC 81 MG tablet, Take 1 tablet (81 mg total) by mouth daily. Swallow whole. (Patient not taking: Reported on 02/05/2022), Disp:  30 tablet, Rfl: 11  Past Medical Problems: Past Medical History:  Diagnosis Date   Allergy    Anxiety    Depression    Diverticulosis    Endometriosis    Gastric ulcer 03/2014   Gastritis    Helicobacter pylori gastritis 2005   Hyperlipidemia    Hypertension    Internal hemorrhoids    Migraine    Stroke [facility])     Past Surgical History: Past Surgical History:  Procedure Laterality Date   CHOLECYSTECTOMY     COLONOSCOPY     LAPAROSCOPIC HYSTERECTOMY     NEPHRECTOMY Left    UPPER GI ENDOSCOPY      Social History: Social History   Socioeconomic History   Marital status: Married    Spouse name: Not on file   Number of children: 2   Years of education: Not on file   Highest education level: Not on file  Occupational History   Occupation: retired  Tobacco Use   Smoking status: Every Day    Packs/day: .25    Types: Cigarettes   Smokeless tobacco: Never   Tobacco comments:    Smokes only a few daily  Vaping Use   Vaping Use: Never used  Substance and Sexual Activity   Alcohol use: No   Drug use: No   Sexual activity: Not on file  Other Topics Concern   Not on file  Social History Narrative   Not on file   Social Determinants of Health   Financial Resource Strain: Not on file  Food Insecurity: Not on file  Transportation Needs: Not on file  Physical Activity: Not on file  Stress: Not on file  Social Connections: Not on file  Intimate Partner Violence: Not on file    Family History: Family History  Problem Relation Age of Onset   Brain cancer Mother    Stomach cancer Father    Brain cancer Father    Colon polyps Brother    Colon cancer Brother    Breast cancer Maternal Aunt    Colon cancer Maternal Grandmother    Colon cancer Paternal Grandmother    Other Son    Esophageal cancer Neg Hx    Rectal cancer Neg Hx     Review of Systems: ROS Denies recent chest pain, difficulty breathing, leg swelling, or fevers.  Physical Exam: Vital  Signs There were no vitals taken for this visit.  Physical Exam Constitutional:      General: Not in acute distress.    Appearance: Normal appearance. Not ill-appearing.  HENT:     Head: Normocephalic and atraumatic.  Eyes:     Pupils: Pupils are equal, round. Cardiovascular:     Rate and Rhythm: Normal rate.    Pulses: Normal pulses.  Pulmonary:     Effort: No respiratory distress or increased work of breathing.  Speaks in full sentences. Abdominal:     General: Abdomen is flat. No distension.   Musculoskeletal: Normal range of motion. No lower extremity swelling or edema. No varicosities. Skin:    General: Skin is warm and dry.     Findings: No erythema or rash.  Neurological:     Mental Status: Alert and oriented to person, place, and time.  Psychiatric:        Mood and Affect: Mood normal.        Behavior: Behavior normal.    Assessment/Plan: The patient is scheduled for bilateral breast implant removal and replacement with Dr.  Lovena Le .  Risks, benefits, and alternatives of procedure discussed, questions answered and consent obtained.    Smoking Status: Current smoker; Counseling Given?  Emphasized the importance of immediate cessation of tobacco.  She states that she has done it recently and can do it again.  She has only been smoking 4 cigarettes/day.  Dr. Lovena Le made aware and is agreeable to proceed. Last Mammogram: 03/2022; Results: Right-sided implant rupture, but otherwise benign.  Caprini Score: 5; Risk Factors include: Age, current tobacco use, and length of planned surgery. Recommendation for mechanical prophylaxis. Encourage early ambulation.   Pictures obtained: 04/22/2022  Post-op Rx sent to pharmacy: Oxycodone and Zofran.  Patient was provided with the General Surgical Risk consent document and Pain Medication Agreement prior to their appointment.  They had adequate time to read through the risk consent documents and Pain Medication Agreement. We also  discussed them in person together during this preop appointment. All of their questions were answered to their satisfaction.  Recommended calling if they have any further questions.  Risk consent form and Pain Medication Agreement to be scanned into patient's chart.  The risks that can be encountered with and after placement of a breast implant placement were discussed and include the following but not limited to these: bleeding, infection, delayed healing, anesthesia risks, skin sensation changes, injury to structures including nerves, blood vessels, and muscles which may be temporary or permanent, allergies to tape, suture materials and glues, blood products, topical preparations or injected agents, skin contour irregularities, skin discoloration and swelling, deep vein thrombosis, cardiac and pulmonary complications, pain, which may persist, fluid accumulation, wrinkling of the skin over the implant, changes in nipple or breast sensation, implant leakage or rupture, faulty position of the implant, persistent pain, formation of tight scar tissue around the implant (capsular contracture), possible need for revisional surgery or staged procedures.    Electronically signed by: Krista Blue, PA-C 05/20/2022 3:01 PM

## 2022-06-14 ENCOUNTER — Encounter: Payer: Self-pay | Admitting: Physician Assistant

## 2022-06-14 ENCOUNTER — Other Ambulatory Visit: Payer: Self-pay

## 2022-06-14 DIAGNOSIS — Z411 Encounter for cosmetic surgery: Secondary | ICD-10-CM

## 2022-06-14 DIAGNOSIS — Z45819 Encounter for adjustment or removal of unspecified breast implant: Secondary | ICD-10-CM

## 2022-06-14 MED ORDER — SULFAMETHOXAZOLE-TRIMETHOPRIM 800-160 MG PO TABS: 1.0000 | ORAL_TABLET | Freq: Two times a day (BID) | ORAL | 0 refills | Status: AC

## 2022-06-15 ENCOUNTER — Ambulatory Visit (INDEPENDENT_AMBULATORY_CARE_PROVIDER_SITE_OTHER): Payer: Self-pay | Admitting: Physician Assistant

## 2022-06-15 DIAGNOSIS — Z9882 Breast implant status: Secondary | ICD-10-CM

## 2022-06-15 NOTE — Progress Notes (Signed)
Patient is a pleasant 62 year old female s/p bilateral implant removal and replacement performed 06/14/2022 by Dr. Ladona Ridgel who joins via telephone encounter for postoperative day 1 call.  Patient states that she is at home, husband is assisting with her postoperative recovery.  She states that she has been ambulatory, eating and drinking without difficulty.  Voiding.  States that her pain is well-controlled.  Overall was quite pleased.  She states that she has not had any drainage on the ABD pads.  She is wearing compressive garments and will adhere to the activity restrictions provided.  She denies any chest pain, difficulty breathing, leg swelling, drainage, asymmetric swelling, nausea/vomiting, or severe pain.  Emphasized the importance of tobacco cessation.  She states that she has not had any since time of surgery and will hold off until after she is recovered.  She understands the importance.  Informed patient that we are unable to tell exactly how large the implants were that were removed at time of surgery, but we were able to provide her with a size that we felt was slightly larger to meet her preference.  Plan for her to come to clinic for her scheduled postoperative visit next week.  She understands that she can call the clinic should she have any questions or concerns in the interim.

## 2022-06-23 ENCOUNTER — Encounter: Payer: Self-pay | Admitting: Plastic Surgery

## 2022-06-23 ENCOUNTER — Ambulatory Visit (INDEPENDENT_AMBULATORY_CARE_PROVIDER_SITE_OTHER): Payer: Self-pay | Admitting: Plastic Surgery

## 2022-06-23 VITALS — BP 136/81 | HR 89

## 2022-06-23 DIAGNOSIS — Z9889 Other specified postprocedural states: Secondary | ICD-10-CM

## 2022-06-23 DIAGNOSIS — Z9882 Breast implant status: Secondary | ICD-10-CM

## 2022-06-23 DIAGNOSIS — Z9886 Personal history of breast implant removal: Secondary | ICD-10-CM

## 2022-06-23 NOTE — Progress Notes (Signed)
Nicole Scott returns today approximately 1 week postop from bilateral ruptured implant removal, capsulectomy, and replacement with new implants.  She is very happy with results.  She states she has had no pain and has only taken Tylenol since surgery.  On physical examination she has excellent shape and symmetry.  Her incisions are clean dry and intact.   Status post breast implant removal and replacement: Doing very well.  May start scar management in 1 week.  Keep scheduled follow-up appointments.

## 2022-07-07 ENCOUNTER — Ambulatory Visit (INDEPENDENT_AMBULATORY_CARE_PROVIDER_SITE_OTHER): Payer: Self-pay | Admitting: Student

## 2022-07-07 VITALS — BP 124/74 | HR 70 | Resp 18

## 2022-07-07 DIAGNOSIS — Z9882 Breast implant status: Secondary | ICD-10-CM

## 2022-07-07 DIAGNOSIS — Z9886 Personal history of breast implant removal: Secondary | ICD-10-CM

## 2022-07-07 DIAGNOSIS — Z9889 Other specified postprocedural states: Secondary | ICD-10-CM

## 2022-07-07 NOTE — Progress Notes (Signed)
Patient is a 62 year old female who underwent bilateral implant removal and replacement with Dr. Ladona Ridgel on 06/14/2022.  Intraoperatively, she had 475 cc high-profile smooth gel implants placed bilaterally.  She is a little over 3 weeks postop.  She presents to the clinic today for postoperative follow-up.  Patient was last seen in the clinic on 06/23/2022.  At this visit, patient reported she was very happy with the results.  She stated that her pain was overall very controlled.  On exam, her breasts had excellent shape and symmetry.  Incisions were clean dry and intact.  Plan is for patient to follow-up at her scheduled appointments.  Today, patient reports she is doing well.  She states that she still has a little bit of residual Dermabond that is peeling off.  She denies any other issues or concerns.  She denies any fevers or chills.  Patient states that she is very happy with her size.  Chaperone present on exam.  On exam, patient is sitting upright in no acute distress.  Breasts are soft and symmetric bilaterally.  There is no overlying erythema.  There are no fluid collections on exam.  NAC's are viable bilaterally.  Incisions are intact and healing well bilaterally with some residual Dermabond over each of them.  Some of this was removed at today's exam.  There were also suture knots noted to both incisions.  These were cut and removed.  Patient tolerated well.  There are no signs of infection on exam.  I discussed with the patient that I would like her to apply Vaseline to her incisions for about a week as show, and then she can transition to scar creams.  I discussed with her the Vaseline should help get the remainder of the Dermabond off.  Patient expressed understanding.  I discussed with the patient would like her to wear compression at all times and avoid strenuous activities.  Patient to follow back up in 2 weeks.  I instructed the patient to call in the meantime if she has any questions or  concerns.

## 2022-07-21 ENCOUNTER — Ambulatory Visit (INDEPENDENT_AMBULATORY_CARE_PROVIDER_SITE_OTHER): Payer: Self-pay | Admitting: Student

## 2022-07-21 DIAGNOSIS — Z9882 Breast implant status: Secondary | ICD-10-CM

## 2022-07-21 DIAGNOSIS — Z9886 Personal history of breast implant removal: Secondary | ICD-10-CM

## 2022-07-21 DIAGNOSIS — Z9889 Other specified postprocedural states: Secondary | ICD-10-CM

## 2022-07-21 NOTE — Progress Notes (Signed)
Patient is a 62 year old female who underwent bilateral implant removal and replacement with Dr. Ladona Ridgel on 06/14/2022.  Intraoperatively, she had 475 cc high-profile smooth gel implants placed bilaterally.  Patient is a little over 5 weeks postop.  She presents to the clinic today for postoperative follow-up.     Patient was last seen in the clinic on 07/07/2022.  At this visit, she reported that she still had a little bit of residual Dermabond peeling off.  She otherwise had no complaints or concerns and was happy with her outcome.  On exam, breasts were soft and symmetric.  There were no fluid collections on exam.  Incisions were intact and healing well bilaterally with some residual Dermabond.  There were also several suture knots noted.  These were cut and removed.  Plan was for patient to apply Vaseline to her incisions and then transition to scar creams.  Today, patient reports she is doing well.  She states that she feels a little suture on her left incision.  She otherwise has no concerns or complaints.  She denies any fevers or chills.  Patient states that she is overall happy with her result.  Chaperone present on exam.  On exam, patient is sitting upright in no acute distress.  Breasts are soft bilaterally and have good symmetry.  There is no overlying erythema or fluid collections palpated on exam.  There are no signs of infection on exam.  Right breast incision is intact and healing well.  To the left breast incision, there is a small suture protruding from the medial aspect of the incision.  This was cut and removed.  The patient tolerated well.  Incision is otherwise healing well.  I discussed with the patient that I would like her to apply Vaseline to the area where the suture was protruding more from for the next 5 to 7 days.  I discussed with her she may transition back to scar creams after that.  Patient expressed understanding.  I discussed with the patient that in about a week or so, she  does not have to wear compression anymore.  I discussed with her that she may transition to regular bras without underwire.  I discussed with the patient she may start gradually increasing her activities.  Patient expressed understanding.  Patient to follow back up in 6 months.  I instructed the patient to call in the meantime she has any questions or concerns about anything.

## 2023-01-12 ENCOUNTER — Ambulatory Visit (INDEPENDENT_AMBULATORY_CARE_PROVIDER_SITE_OTHER): Payer: Self-pay | Admitting: Plastic Surgery

## 2023-01-12 DIAGNOSIS — Z9882 Breast implant status: Secondary | ICD-10-CM

## 2023-01-12 DIAGNOSIS — Z09 Encounter for follow-up examination after completed treatment for conditions other than malignant neoplasm: Secondary | ICD-10-CM

## 2023-01-12 DIAGNOSIS — Z9886 Personal history of breast implant removal: Secondary | ICD-10-CM

## 2023-01-13 NOTE — Progress Notes (Signed)
Nicole Scott returns today 7 months after removal of her implants with replacement of new implants.  She is doing very well and is happy with the overall results.  On examination all incisions are well-healed the breasts are healthy, and the implants are well-placed.  Patient should undergo evaluation of the implants 5 to 7 years after the initial placement so sometime in 2029 - 2031.  She may follow-up with me as needed.

## 2024-02-14 ENCOUNTER — Institutional Professional Consult (permissible substitution) (INDEPENDENT_AMBULATORY_CARE_PROVIDER_SITE_OTHER): Admitting: Otolaryngology

## 2024-03-07 ENCOUNTER — Encounter (INDEPENDENT_AMBULATORY_CARE_PROVIDER_SITE_OTHER): Payer: Self-pay | Admitting: Otolaryngology

## 2024-03-07 ENCOUNTER — Ambulatory Visit (INDEPENDENT_AMBULATORY_CARE_PROVIDER_SITE_OTHER): Admitting: Otolaryngology

## 2024-03-07 VITALS — HR 82 | Ht 61.0 in | Wt 132.0 lb

## 2024-03-07 DIAGNOSIS — K219 Gastro-esophageal reflux disease without esophagitis: Secondary | ICD-10-CM

## 2024-03-07 DIAGNOSIS — J381 Polyp of vocal cord and larynx: Secondary | ICD-10-CM

## 2024-03-07 DIAGNOSIS — R490 Dysphonia: Secondary | ICD-10-CM | POA: Diagnosis not present

## 2024-03-07 MED ORDER — PANTOPRAZOLE SODIUM 40 MG PO TBEC: 40.0000 mg | DELAYED_RELEASE_TABLET | Freq: Two times a day (BID) | ORAL | 1 refills | Status: AC

## 2024-03-07 NOTE — Progress Notes (Signed)
 Reason for Consult: Hoarseness Referring Physician: Dr. Paola Adrien Mulch is an 63 y.o. female.  HPI: History of a neck surgery in February that resulted in a complication of airway issues and she had to be taken back to the operating room for a hematoma and then was intubated for multiple times.  She was in the ICU for 21 days.  According to the family she had multiple intubations and many days of the tube being in.  She now has hoarseness ever since the tubes were finally removed and she was discharged.  She has seen ENT in [place] and told there was no vocal cord paralysis.  She does have reflux problems and takes omeprazole  once a day for years.  She has no dysphagia or odynophagia.  No pain in the throat.  Past Medical History:  Diagnosis Date   Allergy    Anxiety    Depression    Diverticulosis    Endometriosis    Gastric ulcer 03/2014   Gastritis    Helicobacter pylori gastritis 2005   Hyperlipidemia    Hypertension    Internal hemorrhoids    Migraine    Stroke [facility])     Past Surgical History:  Procedure Laterality Date   CHOLECYSTECTOMY     COLONOSCOPY     LAPAROSCOPIC HYSTERECTOMY     NEPHRECTOMY Left    UPPER GI ENDOSCOPY      Family History  Problem Relation Age of Onset   Brain cancer Mother    Stomach cancer Father    Brain cancer Father    Colon polyps Brother    Colon cancer Brother    Breast cancer Maternal Aunt    Colon cancer Maternal Grandmother    Colon cancer Paternal Grandmother    Other Son    Esophageal cancer Neg Hx    Rectal cancer Neg Hx     Social History:  reports that she has been smoking cigarettes. She has never used smokeless tobacco. She reports that she does not drink alcohol and does not use drugs.  Allergies: Allergies[1]   No results found for this or any previous visit (from the past 48 hours).  No results found.  ROS Pulse 82, height 5' 1 (1.549 m), weight 132 lb (59.9 kg), SpO2 92%. Physical Exam Constitutional:       Appearance: Normal appearance.  HENT:     Head: Normocephalic and atraumatic.     Right Ear: Tympanic membrane is without lesions and middle ear aerated, ear canal and external ear normal.     Left Ear: Tympanic membrane is without lesions and middle ear aerated, ear canal and external ear normal.     Nose: Nose without deviation of septum.  Turbinates with mild hypertrophy, No significant swelling or masses.     Oral cavity/oropharynx: Mucous membranes are moist. No lesions or masses    Larynx: Very hoarse and harsh voice. Mirror attempted without success    Eyes:     Extraocular Movements: Extraocular movements intact.     Conjunctiva/sclera: Conjunctivae normal.     Pupils: Pupils are equal, round, and reactive to light.  Cardiovascular:     Rate and Rhythm: Normal rate.  Pulmonary:     Effort: Pulmonary effort is normal.  Musculoskeletal:     Cervical back: Normal range of motion and neck supple. No rigidity.  Lymphadenopathy:     Cervical: No cervical adenopathy or masses.salivary glands without lesions. .     Salivary glands- no mass or  swelling Neurological:     Mental Status: He is alert. CN 2-12 intact. No nystagmus  Flexible fibroptic laryngoscopy  Patient was informed of risks, benefits, and options. All questions answered. Consent obtained.   The scope was passed through the nose and tracked into the nasopharynx. The nasopharynx without lesions or masses. The scope was positioned over the base of tongue and epiglottis. There was no obvious lesions or significant swelling and any of the laryngeal or pharyngeal structures. The vocal cords move well but there is obvious polyp on the left side that has a Reinke's space edema appearance.  The right vocal cord is slightly thickened.  The left polyp does cross the midline and extends across the entire vocal cord.  The subglottis has minimal visualization but no lesions identified. The scope was removed without difficulty and  patient tolerated well.      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  We talked about microlaryngoscopy but first she needs aggressive reflux treatment.  It is not clear at this moment whether that will give her complete benefit but at least she needs to try that for a couple of months.  We talked about reflux precautions.  She will follow-up in 1 month.  Norleen Notice 03/07/2024, 12:00 PM        [1]  Allergies Allergen Reactions   Doxycycline    Hydromorphone    Levofloxacin  Other (See Comments)   Penicillins Swelling

## 2024-03-29 ENCOUNTER — Other Ambulatory Visit (INDEPENDENT_AMBULATORY_CARE_PROVIDER_SITE_OTHER): Payer: Self-pay | Admitting: Otolaryngology

## 2024-04-10 ENCOUNTER — Ambulatory Visit (INDEPENDENT_AMBULATORY_CARE_PROVIDER_SITE_OTHER): Admitting: Otolaryngology

## 2024-04-12 ENCOUNTER — Encounter (INDEPENDENT_AMBULATORY_CARE_PROVIDER_SITE_OTHER): Payer: Self-pay | Admitting: Otolaryngology

## 2024-04-12 ENCOUNTER — Ambulatory Visit (INDEPENDENT_AMBULATORY_CARE_PROVIDER_SITE_OTHER): Admitting: Otolaryngology

## 2024-04-12 VITALS — BP 121/75 | HR 78 | Ht 61.0 in | Wt 132.0 lb

## 2024-04-12 DIAGNOSIS — J381 Polyp of vocal cord and larynx: Secondary | ICD-10-CM

## 2024-04-12 DIAGNOSIS — K219 Gastro-esophageal reflux disease without esophagitis: Secondary | ICD-10-CM

## 2024-04-12 NOTE — Progress Notes (Signed)
 Reason for Consult: Vocal cord polyp Referring Physician: Dr. Paola Adrien Nicole Scott is an 64 y.o. female.  HPI: She is no different with the reflux treatment and now ready to proceed with something that will allow her to have a better voice.  No new symptoms.  Past Medical History:  Diagnosis Date   Allergy    Anxiety    Depression    Diverticulosis    Endometriosis    Gastric ulcer 03/2014   Gastritis    Helicobacter pylori gastritis 2005   Hyperlipidemia    Hypertension    Internal hemorrhoids    Migraine    Stroke [facility])     Past Surgical History:  Procedure Laterality Date   CHOLECYSTECTOMY     COLONOSCOPY     LAPAROSCOPIC HYSTERECTOMY     NEPHRECTOMY Left    UPPER GI ENDOSCOPY      Family History  Problem Relation Age of Onset   Brain cancer Mother    Stomach cancer Father    Brain cancer Father    Colon polyps Brother    Colon cancer Brother    Breast cancer Maternal Aunt    Colon cancer Maternal Grandmother    Colon cancer Paternal Grandmother    Other Son    Esophageal cancer Neg Hx    Rectal cancer Neg Hx     Social History:  reports that she has been smoking cigarettes. She has never used smokeless tobacco. She reports that she does not drink alcohol and does not use drugs.  Allergies: Allergies[1]   No results found for this or any previous visit (from the past 48 hours).  No results found.  ROS Blood pressure 121/75, pulse 78, height 5' 1 (1.549 m), weight 132 lb (59.9 kg), SpO2 91%. Physical Exam Constitutional:      Appearance: Normal appearance.  HENT:     Head: Normocephalic and atraumatic.     Right Ear: Tympanic membrane is without lesions and middle ear aerated, ear canal and external ear normal.     Left Ear: Tympanic membrane is without lesions and middle ear aerated, ear canal and external ear normal.     Nose: Nose without deviation of septum.  Turbinates with mild hypertrophy, No significant swelling or masses.     Oral  cavity/oropharynx: Mucous membranes are moist. No lesions or masses    Larynx: Gravelly harsh voice . Mirror attempted without success    Eyes:     Extraocular Movements: Extraocular movements intact.     Conjunctiva/sclera: Conjunctivae normal.     Pupils: Pupils are equal, round, and reactive to light.  Cardiovascular:     Rate and Rhythm: Normal rate.  Pulmonary:     Effort: Pulmonary effort is normal.  Musculoskeletal:     Cervical back: Normal range of motion and neck supple. No rigidity.  Lymphadenopathy:     Cervical: No cervical adenopathy or masses.salivary glands without lesions. .     Salivary glands- no mass or swelling Neurological:     Mental Status: He is alert. CN 2-12 intact. No nystagmus   Flexible fibroptic laryngoscopy  Patient was informed of risks, benefits, and options. All questions answered. Consent obtained.   The scope was passed through the nose and tracked into the nasopharynx. The nasopharynx without lesions or masses. The scope was positioned over the base of tongue and epiglottis. There was no obvious lesions or significant swelling and any of the laryngeal or pharyngeal structures. The vocal cords move well.  There is an obvious polyp on the left side and the right vocal cord is slightly thickened as well.  Still benign appearing and most consistent with Reinke's space edema the subglottis has minimal visualization but no lesions identified. The scope was removed without difficulty and patient tolerated well.     Assessment/Plan: Vocal cord polyps-she now is ready to proceed.'s been a year and no improvement.  She tried reflux treatment.  I spoke with Dr. Ramsey and she will proceed with a microlaryngoscopy.  The procedure was discussed including risk, benefits, and options.  All her questions were answered and consent was obtained.  This will be scheduled.  Nicole Scott Notice 04/12/2024, 11:48 AM         [1]  Allergies Allergen Reactions    Doxycycline    Hydromorphone    Levofloxacin  Other (See Comments)   Penicillins Swelling
